# Patient Record
Sex: Male | Born: 1960 | Race: White | Hispanic: No | Marital: Married | Smoking: Never smoker
Health system: Southern US, Community
[De-identification: ages and names within clinical notes are randomized; demographics above are authoritative.]

## PROBLEM LIST (undated history)

## (undated) DIAGNOSIS — E559 Vitamin D deficiency, unspecified: Secondary | ICD-10-CM

## (undated) DIAGNOSIS — I10 Essential (primary) hypertension: Secondary | ICD-10-CM

## (undated) DIAGNOSIS — K5730 Diverticulosis of large intestine without perforation or abscess without bleeding: Secondary | ICD-10-CM

## (undated) DIAGNOSIS — K760 Fatty (change of) liver, not elsewhere classified: Secondary | ICD-10-CM

## (undated) DIAGNOSIS — K5090 Crohn's disease, unspecified, without complications: Secondary | ICD-10-CM

## (undated) DIAGNOSIS — E785 Hyperlipidemia, unspecified: Secondary | ICD-10-CM

## (undated) DIAGNOSIS — N289 Disorder of kidney and ureter, unspecified: Secondary | ICD-10-CM

## (undated) DIAGNOSIS — E538 Deficiency of other specified B group vitamins: Secondary | ICD-10-CM

## (undated) DIAGNOSIS — K7689 Other specified diseases of liver: Secondary | ICD-10-CM

## (undated) DIAGNOSIS — K648 Other hemorrhoids: Secondary | ICD-10-CM

## (undated) DIAGNOSIS — K529 Noninfective gastroenteritis and colitis, unspecified: Secondary | ICD-10-CM

## (undated) HISTORY — DX: Noninfective gastroenteritis and colitis, unspecified: K52.9

## (undated) HISTORY — DX: Deficiency of other specified B group vitamins: E53.8

## (undated) HISTORY — DX: Other specified diseases of liver: K76.89

## (undated) HISTORY — DX: Essential (primary) hypertension: I10

## (undated) HISTORY — DX: Fatty (change of) liver, not elsewhere classified: K76.0

## (undated) HISTORY — DX: Vitamin D deficiency, unspecified: E55.9

## (undated) HISTORY — DX: Crohn's disease, unspecified, without complications: K50.90

## (undated) HISTORY — DX: Other hemorrhoids: K64.8

## (undated) HISTORY — DX: Disorder of kidney and ureter, unspecified: N28.9

## (undated) HISTORY — DX: Hyperlipidemia, unspecified: E78.5

## (undated) HISTORY — DX: Diverticulosis of large intestine without perforation or abscess without bleeding: K57.30

---
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
## 1997-11-09 HISTORY — PX: KNEE SURGERY: SHX244

## 1999-11-10 HISTORY — PX: APPENDECTOMY: SHX54

## 2000-02-22 ENCOUNTER — Encounter: Payer: Self-pay | Admitting: Emergency Medicine

## 2000-02-22 ENCOUNTER — Encounter (INDEPENDENT_AMBULATORY_CARE_PROVIDER_SITE_OTHER): Payer: Self-pay | Admitting: *Deleted

## 2000-02-22 ENCOUNTER — Encounter (INDEPENDENT_AMBULATORY_CARE_PROVIDER_SITE_OTHER): Payer: Self-pay | Admitting: Specialist

## 2000-02-23 ENCOUNTER — Inpatient Hospital Stay (HOSPITAL_COMMUNITY): Admission: EM | Admit: 2000-02-23 | Discharge: 2000-02-24 | Payer: Self-pay | Admitting: Emergency Medicine

## 2000-02-24 ENCOUNTER — Encounter (INDEPENDENT_AMBULATORY_CARE_PROVIDER_SITE_OTHER): Payer: Self-pay | Admitting: *Deleted

## 2005-01-22 ENCOUNTER — Ambulatory Visit: Payer: Self-pay | Admitting: Gastroenterology

## 2006-07-13 ENCOUNTER — Ambulatory Visit: Payer: Self-pay | Admitting: Gastroenterology

## 2006-09-13 ENCOUNTER — Ambulatory Visit: Payer: Self-pay | Admitting: Gastroenterology

## 2006-09-13 ENCOUNTER — Encounter (INDEPENDENT_AMBULATORY_CARE_PROVIDER_SITE_OTHER): Payer: Self-pay | Admitting: *Deleted

## 2006-12-18 ENCOUNTER — Emergency Department (HOSPITAL_COMMUNITY): Admission: EM | Admit: 2006-12-18 | Discharge: 2006-12-18 | Payer: Self-pay | Admitting: Emergency Medicine

## 2006-12-18 ENCOUNTER — Encounter (INDEPENDENT_AMBULATORY_CARE_PROVIDER_SITE_OTHER): Payer: Self-pay | Admitting: *Deleted

## 2007-11-03 IMAGING — CT CT PELVIS W/O CM
2 of 3 series · 16 of 42 positions shown, 19 images · IV contrast (agent unspecified)
Comparison: None available.

CLINICAL DATA: 45 year old with abdominal pain.  Rule out stone.  No hematuria. Prior appendicitis, appendectomy.
 ABDOMEN CT WITHOUT CONTRAST:
TECHNIQUE: Multidetector CT imaging of the abdomen was performed following the standard protocol without IV contrast.
TECHNIQUE: Multidetector CT imaging of the pelvis was performed following the standard protocol without IV contrast.

[Series 2: renal stone · axial · 0.82mm/px · z∈[-479,-79]mm · 13 of 89 slices shown, 16 images]
[im 6/89  soft-tissue]
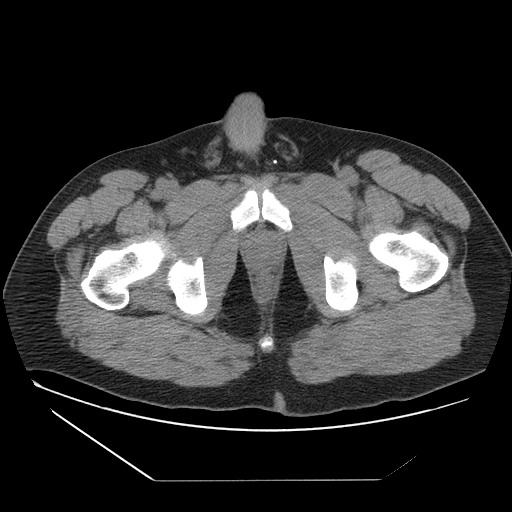
[im 6/89  bone]
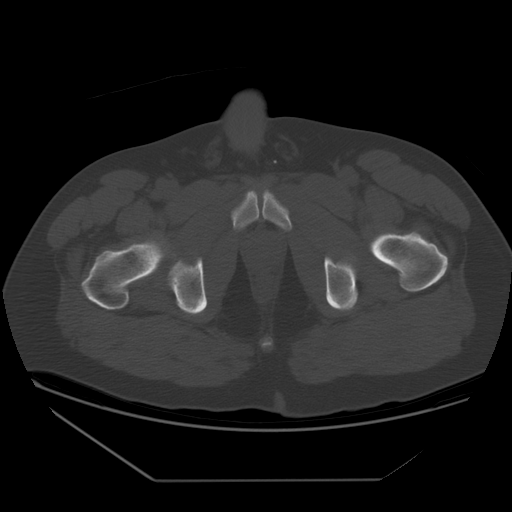
[im 15/89  soft-tissue]
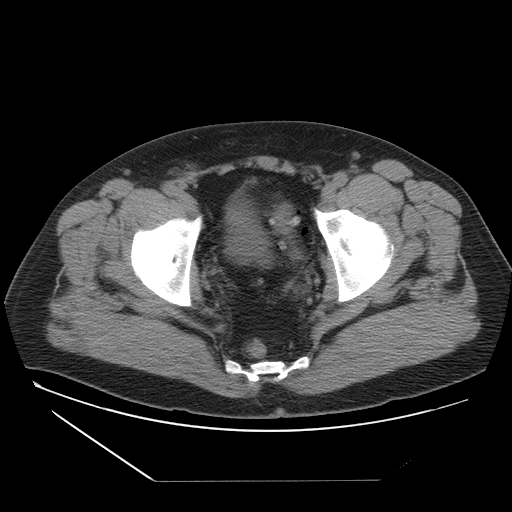
[im 23/89  soft-tissue]
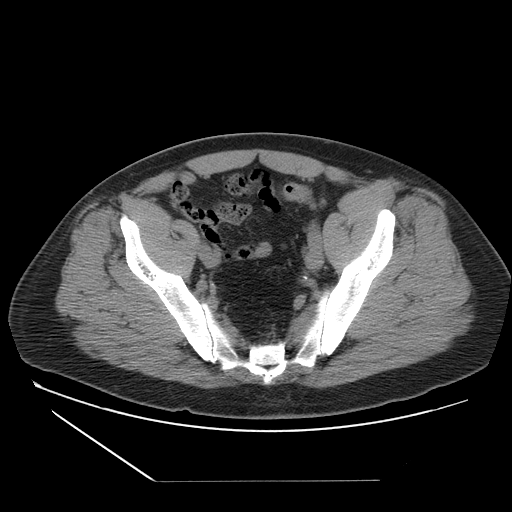
[im 32/89  soft-tissue]
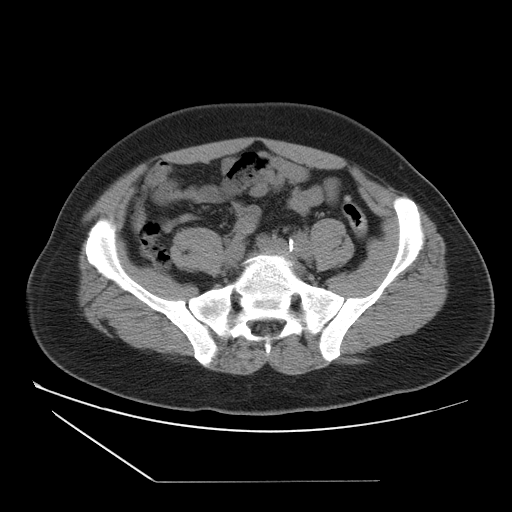
[im 40/89  soft-tissue]
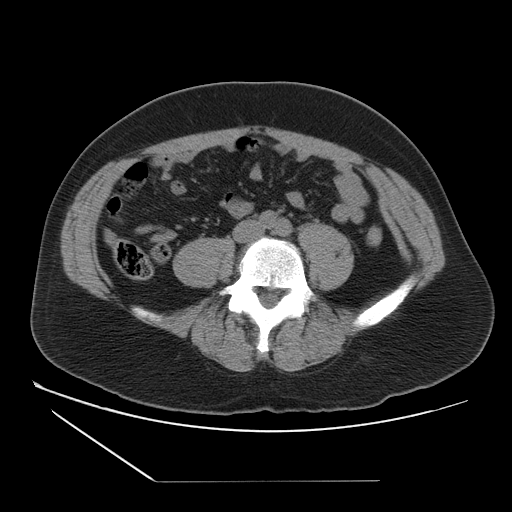
[im 49/89  soft-tissue]
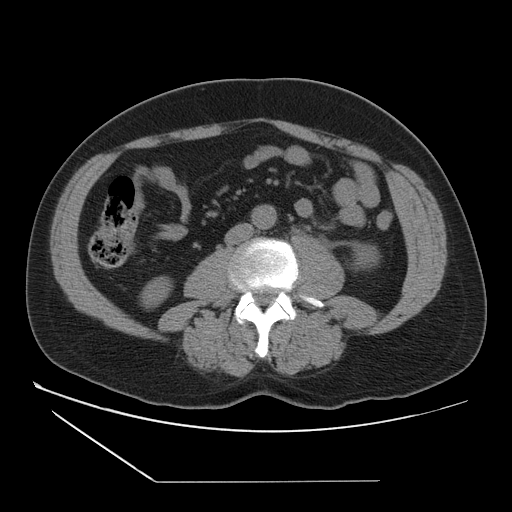
[im 57/89  soft-tissue]
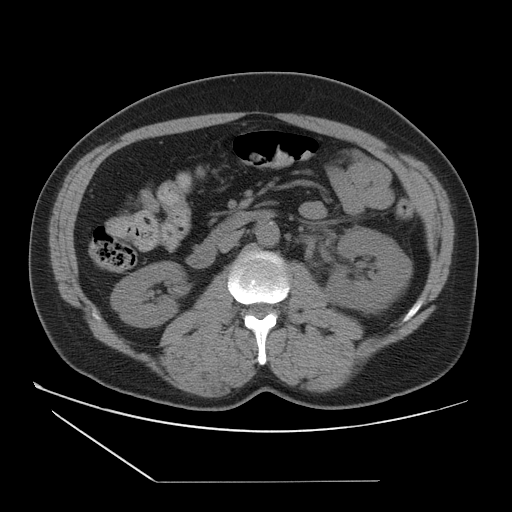
[im 66/89  soft-tissue]
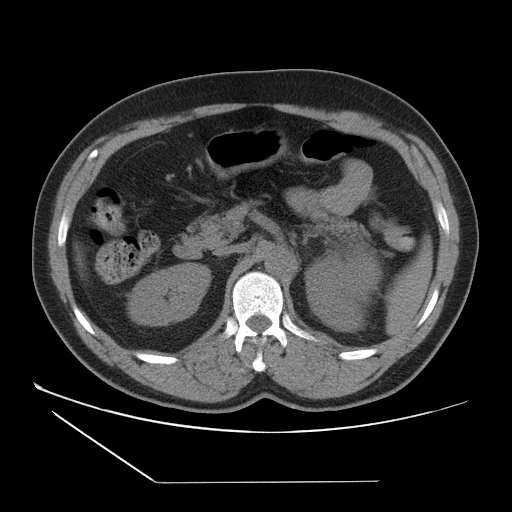
[im 74/89  soft-tissue]
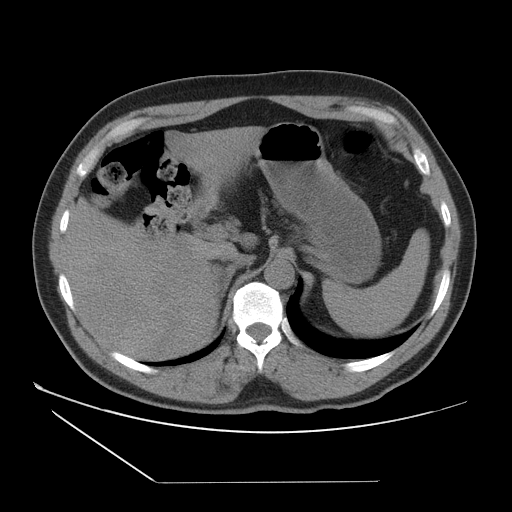
[im 74/89  bone]
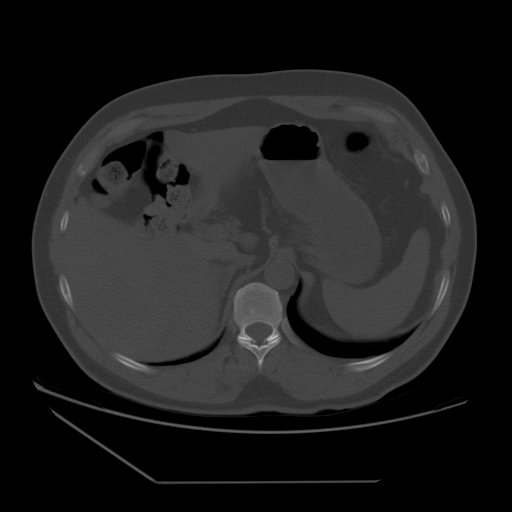
[im 77/89  lung]
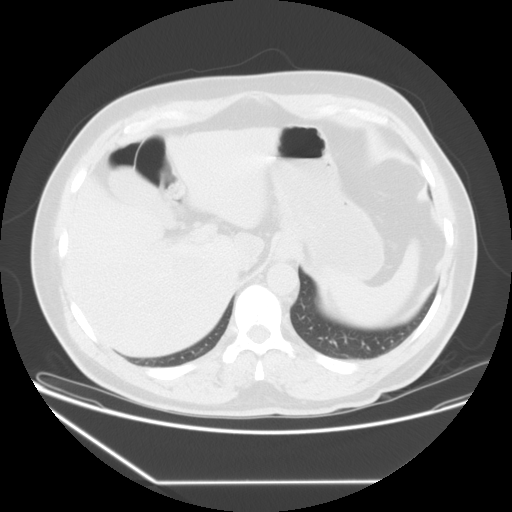
[im 80/89  lung]
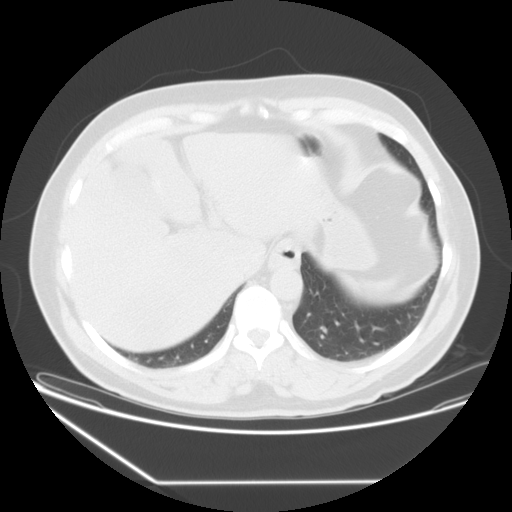
[im 83/89  soft-tissue]
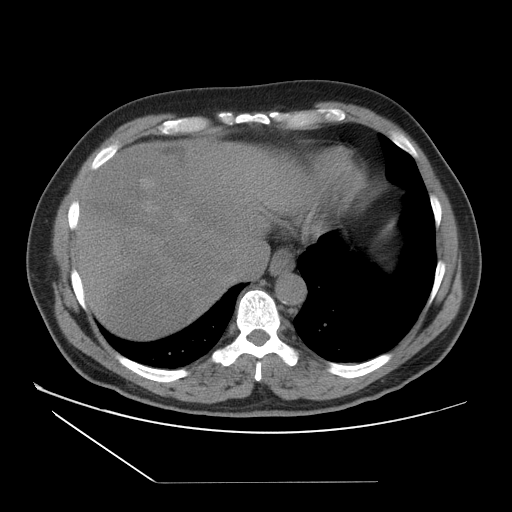
[im 83/89  lung]
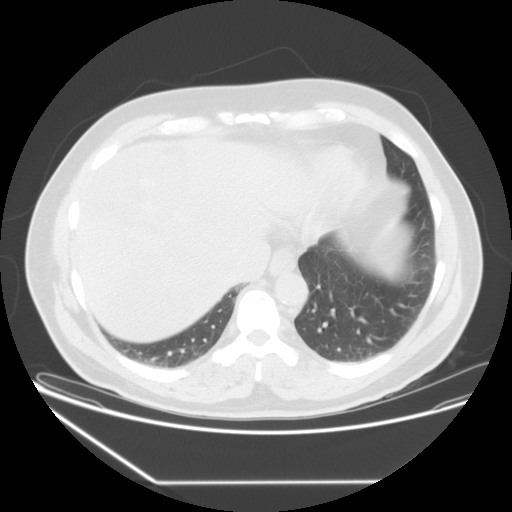
[im 86/89  lung]
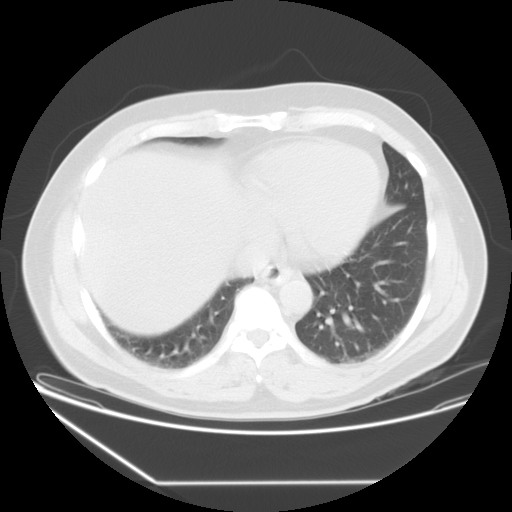

[Series 301: sagittals · sagittal · 0.92mm/px · 3 of 98 slices shown]
[im 20/98  soft-tissue]
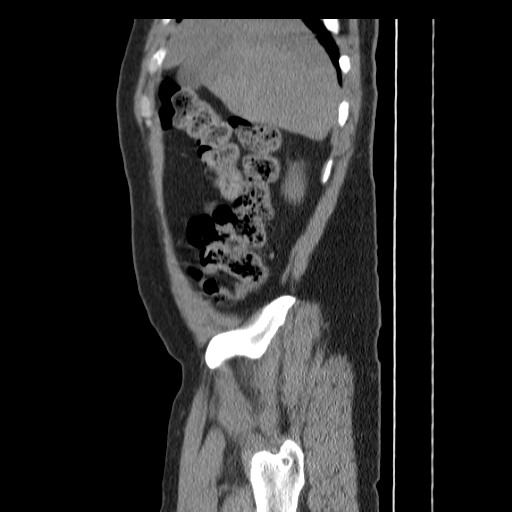
[im 39/98  soft-tissue]
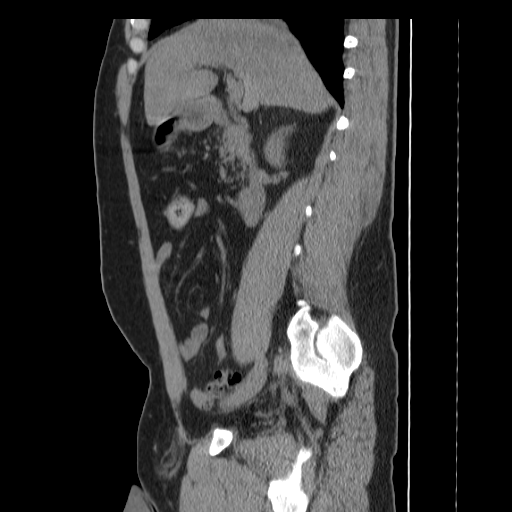
[im 59/98  soft-tissue]
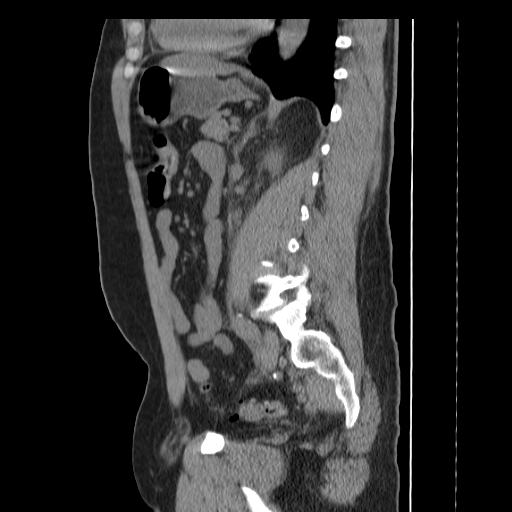

[16 of 42 positions shown; findings below may reference images not displayed]

FINDINGS: Images of the lung bases are unremarkable.  The liver is heterogeneous with both high attenuation and low attenuation regions.  Further evaluation with liver MRI is suggested to exclude hepatic lesions.  
 There is marked left hydronephrosis and perinephric fluid.  The left collecting system is dilated to the level of the mid ureter at L3 where there is a 3 mm calculus causing high-grade obstruction.  
 No focal abnormality is seen within the spleen, pancreas, or right kidney.  The gallbladder is present.  No retroperitoneal adenopathy is identified.
IMPRESSION: 1.  Heterogeneous liver consistent with fatty infiltration but liver lesions are not excluded and further evaluation with MRI is suggested.
 2.  Marked perinephric stranding on the left of the kidney secondary to a 3 mm calculus at the level of L3.  
 PELVIS CT WITHOUT CONTRAST:
FINDINGS: There are numerous colonic diverticula.  No CT evidence for acute diverticulitis however.
IMPRESSION: Diverticulosis.

## 2008-05-16 ENCOUNTER — Telehealth: Payer: Self-pay | Admitting: Gastroenterology

## 2008-05-21 ENCOUNTER — Encounter: Payer: Self-pay | Admitting: Gastroenterology

## 2008-05-21 DIAGNOSIS — K5730 Diverticulosis of large intestine without perforation or abscess without bleeding: Secondary | ICD-10-CM | POA: Insufficient documentation

## 2008-05-21 DIAGNOSIS — E785 Hyperlipidemia, unspecified: Secondary | ICD-10-CM | POA: Insufficient documentation

## 2008-05-21 DIAGNOSIS — K5090 Crohn's disease, unspecified, without complications: Secondary | ICD-10-CM | POA: Insufficient documentation

## 2008-05-21 DIAGNOSIS — K5900 Constipation, unspecified: Secondary | ICD-10-CM | POA: Insufficient documentation

## 2008-05-21 DIAGNOSIS — K7689 Other specified diseases of liver: Secondary | ICD-10-CM

## 2008-05-22 ENCOUNTER — Ambulatory Visit: Payer: Self-pay | Admitting: Gastroenterology

## 2008-05-22 LAB — CONVERTED CEMR LAB: Tissue Transglutaminase Ab, IgA: 0.8 units (ref <7)

## 2009-03-04 ENCOUNTER — Encounter: Payer: Self-pay | Admitting: Gastroenterology

## 2009-05-23 ENCOUNTER — Ambulatory Visit: Payer: Self-pay | Admitting: Gastroenterology

## 2009-05-23 ENCOUNTER — Telehealth: Payer: Self-pay | Admitting: Gastroenterology

## 2009-05-23 LAB — CONVERTED CEMR LAB: Ceruloplasmin: 28 mg/dL (ref 21–63)

## 2009-05-24 DIAGNOSIS — E538 Deficiency of other specified B group vitamins: Secondary | ICD-10-CM | POA: Insufficient documentation

## 2009-05-24 LAB — CONVERTED CEMR LAB
Ferritin: 172.6 ng/mL (ref 22.0–322.0)
Folate: 8.4 ng/mL
Iron: 130 ug/dL (ref 42–165)
Saturation Ratios: 37.9 % (ref 20.0–50.0)
Transferrin: 244.9 mg/dL (ref 212.0–360.0)
Vitamin B-12: 196 pg/mL — ABNORMAL LOW (ref 211–911)

## 2009-05-27 ENCOUNTER — Telehealth: Payer: Self-pay | Admitting: Gastroenterology

## 2009-05-30 ENCOUNTER — Ambulatory Visit: Payer: Self-pay | Admitting: Gastroenterology

## 2009-06-06 ENCOUNTER — Ambulatory Visit: Payer: Self-pay | Admitting: Gastroenterology

## 2009-06-13 ENCOUNTER — Ambulatory Visit: Payer: Self-pay | Admitting: Gastroenterology

## 2009-07-16 ENCOUNTER — Ambulatory Visit: Payer: Self-pay | Admitting: Gastroenterology

## 2009-08-12 ENCOUNTER — Encounter: Payer: Self-pay | Admitting: Gastroenterology

## 2009-08-14 ENCOUNTER — Telehealth (INDEPENDENT_AMBULATORY_CARE_PROVIDER_SITE_OTHER): Payer: Self-pay | Admitting: *Deleted

## 2009-08-14 ENCOUNTER — Ambulatory Visit: Payer: Self-pay | Admitting: Gastroenterology

## 2009-08-21 ENCOUNTER — Telehealth (INDEPENDENT_AMBULATORY_CARE_PROVIDER_SITE_OTHER): Payer: Self-pay | Admitting: *Deleted

## 2010-01-28 ENCOUNTER — Encounter: Payer: Self-pay | Admitting: Gastroenterology

## 2010-05-08 ENCOUNTER — Ambulatory Visit: Payer: Self-pay | Admitting: Gastroenterology

## 2010-05-13 ENCOUNTER — Telehealth: Payer: Self-pay | Admitting: Gastroenterology

## 2010-08-19 ENCOUNTER — Telehealth: Payer: Self-pay | Admitting: Gastroenterology

## 2010-12-11 NOTE — Progress Notes (Signed)
Summary: triage  Medications Added COLAZAL 750 MG CAPS (BALSALAZIDE DISODIUM) 2 tabs two times a day       Phone Note Call from Patient Call back at Home Phone [phone]   Caller: Patient Call For: Sharlett Iles Reason for Call: Talk to Nurse Summary of Call: Patient states that his rx was not sent to his pharmacy, patient there to pick it up. Patient states that he had a flare up in the weekend and is about to go to a trip would like some different meds prescribed. Initial call taken by: Ronalee Red,  May 13, 2010 9:32 AM  Follow-up for Phone Call        Line busy.  Butch Penny Surface RN  May 13, 2010 2:34 PM  Pt has flare up of abd pain this weekend.  Still taking lialda.  To start new med.  See OV note.  Pt is leaving on Sunday to go to [place] and asks if he can have on rx for an antiobiotic to take with him incase he has " a spell" while he is away.  Will start calazal tomorrow.   Follow-up by: Donna Surface RN,  May 13, 2010 4:03 PM  Additional Follow-up for Phone Call Additional follow up Details #1::        no Additional Follow-up by: Jewelz Kobus R Jansen Goodpasture MD FACG,  May 13, 2010 4:15 PM    Additional Follow-up for Phone Call Additional follow up Details #2::    Pt informed.  Instucted pt to get ned medication filled and start on it asap.  Pt instructed to call  if symptoms do not improve. Follow-up by: Donna Surface RN,  May 14, 2010 11:11 AM  New/Updated Medications: COLAZAL 750 MG CAPS (BALSALAZIDE DISODIUM) 2 tabs two times a day Prescriptions: COLAZAL 750 MG CAPS (BALSALAZIDE DISODIUM) 2 tabs two times a day  #120 x 6   Entered by:   Donna Surface RN   Authorized by:   Almeda Ezra R Antonie Borjon MD FACG   Signed by:   Donna Surface RN on 05/13/2010   Method used:   Electronically to        [id number]* (retail)       [address], [place]  [address]       Ph: [phone] or [phone]       Fax: [phone]  RxID:   [id number]

## 2010-12-11 NOTE — Op Note (Signed)
Summary: Appendectomy                    [place]  Patient:    Brian Estes, Brian Estes                          MRN: [id number] Proc. Date: 02/24/00 Adm. Date:  [id number] Disc. Date: [id number] Attending:  Gwenyth Ober                           Operative Report  PREOPERATIVE DIAGNOSIS:  Acute appendicitis.  POSTOPERATIVE DIAGNOSIS:  Acute appendicitis.  PROCEDURE:  Laparoscopic appendectomy.  SURGEON:  Judeth Horn, M.D.  ASSISTANT:  None.  ANESTHESIA:  General endotracheal.  ESTIMATED BLOOD LOSS:  100 cc.  COMPLICATIONS:  Tear of the tip of the distal appendix.  CONDITION:  Stable.  INDICATIONS FOR PROCEDURE:  The patient is a 50 year old gentleman with abdominal pain, severe right lower quadrant, associated with a elevated white count.  he ome in for appendectomy.  FINDINGS:  The patient had an acutely inflamed retrocecal appendix which was removed laparoscopically.  DESCRIPTION OF PROCEDURE:  The patient was taken to the operating room and placed on the table in the supine position.  After an adequate general anesthetic was administered, he was prepped and draped in the usual sterile manner exposing the midline and the right lower quadrant of the abdomen.  Initially, trocars were placed in the supraumbilical, the right upper quadrant a 5 mm) and a suprapubic using 5 and 12 mm respectively.  This was done after insufflating the abdomen with carbon dioxide insufflation up to a maximum intra-abdominal pressure of 50 mmHg using the Veress needle.  Once this was done and the cannulas were in place, the patient was placed in Trendelenburg position. The gallbladder was reflected from the normal.  The patient did have mildly elevated liver function tests.  The patient did have some adhesions of the appendices epiploica of the sigmoid colon to the anterior abdominal wall, which  were taken down using sharp scissor dissection.  There was no bleeding  from this part of the procedure.  The right lower quadrant had a difficultly placed and adhesed appendix which had to be taken off the lateral posterior abdominal wall  using blunt and sharp dissection.  We were able to free it up and then subsequently transect the appendix using an Endo GIA with the blue stapler and then, subsequently, the mesentery was taken between white staplers.  There was some bleeding which had to be Endoclipped individually using ______ .  We irrigated with copious amounts of warm saline and, at the completion, there was no bleeding. We inspected inferiorly into the pelvis.  There was no bleeding.  We subsequently removed all cannulas then closed the suprapubic and the periumbilical fascia using a figure-of-eight stitch of 4-0 Vicryl.  The skin was closed using a running subcuticular stitch of 5-0 Vicryl.  All needle counts, sponge counts and instruments counts were correct.  Each trocar site was closed and injected with  0.25% Marcaine with epinephrine. DD:  02/24/00 TD:  02/24/00 Job: [id number]

## 2010-12-11 NOTE — Progress Notes (Signed)
Summary: Medication   Phone Note Call from Patient Call back at Home Phone [phone]   Caller: Patient Call For: Dr. Sharlett Iles Reason for Call: Talk to Nurse Summary of Call: Wants to discuss his meds. Initial call taken by: Webb Laws,  August 19, 2010 10:20 AM  Follow-up for Phone Call        patient that the colazal constipates him and he wants to change back  to his AZULFIDINE 500 MG TABS which he was on before he started Lialda he changed due to cost to Colazal. What does can he take of AZULFIDINE 500 MG TABS. Follow-up by: Bernita Buffy CMA Deborra Medina),  August 19, 2010 10:31 AM  Additional Follow-up for Phone Call Additional follow up Details #1::        spoke to [facility], [place] and she wants the patient to wait until DRP returns. I have advised the patient that when Dr. Sharlett Iles return I and reviews I will call him back. He now tells me he is not currently on any medication he stopped the Colazal due to the constipation.  Additional Follow-up by: Bernita Buffy CMA Deborra Medina),  August 19, 2010 2:36 PM    Additional Follow-up for Phone Call Additional follow up Details #2::    Azulfidine 500 mg 2 tablets twice a day is fine Follow-up by: Sable Feil MD Lauralee Evener 11:56 AM  Additional Follow-up for Phone Call Additional follow up Details #3:: Details for Additional Follow-up Action Taken: pt aware, rx sent. Additional Follow-up by: Bernita Buffy CMA Deborra Medina),  August 21, 2010 12:32 PM  New/Updated Medications: AZULFIDINE 500 MG TABS (SULFASALAZINE) take two tabs by mouth two times a day Prescriptions: AZULFIDINE 500 MG TABS (SULFASALAZINE) take two tabs by mouth two times a day  #120 x 11   Entered by:   Bernita Buffy CMA ([place])   Authorized by:   Sable Feil MD [facility]   Signed by:   Bernita Buffy CMA ([place]) on 08/21/2010   Method used:   Electronically to        [facility] [phone]* (retail)       [address] [place], [place]  [address]       Ph: [phone] or [phone]       Fax: [phone]   RxID:   [id number]

## 2010-12-11 NOTE — Assessment & Plan Note (Signed)
Summary: YEARLY FOLLOW UP..MED REFILL/SP    History of Present Illness Visit Type: Follow-up Visit Primary GI MD: Verl Blalock MD [place] Primary Provider: Redge Gainer, MD  Requesting Provider: na Chief Complaint: F/u for Crohn's. Patient states that he is doing great and denies any GI complaints. Pt needs Lialda refilled History of Present Illness:   Patient is doing well without GI complaints. He complains that Lialda is too expensive. He is having regular bowel movements without abdominal pain or rectal bleeding. He has some periodically mildly abnormal liver function tests probably related to statin medications. He has had B12 deficiency but is not on regular replacement therapy. He denies any extracolonic manifestations of inflammatory bowel disease. Previous serologic markers suggested Crohn's disease. He is followed regularly by Dr. Redge Gainer and is on Benicar, Crestor, Propecia, and vitamin D. He denies any current neurological difficulties. He is status post appendectomy.   GI Review of Systems      Denies abdominal pain, acid reflux, belching, bloating, chest pain, dysphagia with liquids, dysphagia with solids, heartburn, loss of appetite, nausea, vomiting, vomiting blood, weight loss, and  weight gain.        Denies anal fissure, black tarry stools, change in bowel habit, constipation, diarrhea, diverticulosis, fecal incontinence, heme positive stool, hemorrhoids, irritable bowel syndrome, jaundice, light color stool, liver problems, rectal bleeding, and  rectal pain.    Current Medications (verified): 1)  Lialda 1.2 Gm  Tbec (Mesalamine) .... Take 2 By Mouth Every Morning 2)  Crestor 20 Mg  Tabs (Rosuvastatin Calcium) .Marland Kitchen.. 1 Once Daily 3)  Benicar 20 Mg  Tabs (Olmesartan Medoxomil) .Marland Kitchen.. 1 Once Daily 4)  Propecia 1 Mg  Tabs (Finasteride) .Marland Kitchen.. 1 Once Daily 5)  Vitamin D 1000 Unit  Tabs (Cholecalciferol) .Marland Kitchen.. 1 Once Daily  Allergies (verified): 1)  ! Asa  Past  History:  Past medical, surgical, family and social histories (including risk factors) reviewed for relevance to current acute and chronic problems.  Past Medical History: Reviewed history from 05/23/2009 and no changes required. Crohns Disease Hyperlipidemia Kidney Stones Hypertension  Past Surgical History: Reviewed history from 05/23/2009 and no changes required. right knee surgery 1999 iguinal hernia repair 1976 Appendectomy 2001  Family History: Reviewed history from 05/22/2008 and no changes required. No FH of Colon Cancer: Family History of Heart Disease: mom  Social History: Reviewed history from 05/23/2009 and no changes required. Occupation: Self Employed  Patient has never smoked.  Alcohol Use - no Daily Caffeine Use Illicit Drug Use - no Patient does not get regular exercise.  Married 1 boy 1 girl  Review of Systems  The patient denies allergy/sinus, anemia, anxiety-new, arthritis/joint pain, back pain, blood in urine, breast changes/lumps, change in vision, confusion, cough, coughing up blood, depression-new, fainting, fatigue, fever, headaches-new, hearing problems, heart murmur, heart rhythm changes, itching, muscle pains/cramps, night sweats, nosebleeds, shortness of breath, skin rash, sleeping problems, sore throat, swelling of feet/legs, swollen lymph glands, thirst - excessive, urination - excessive, urination changes/pain, urine leakage, vision changes, voice change, menstrual pain, pregnancy symptoms, thirst - excessive , and urination - excessive .    Vital Signs:  Patient profile:   50 year old male Height:      70 inches Weight:      214 pounds BMI:     30.82 BSA:     2.15 Pulse rate:   76 / minute Pulse rhythm:   regular BP sitting:   132 / 80  (left arm) Cuff size:  regular  Vitals Entered By: Hope Pigeon CMA (May 08, 2010 8:33 AM)  Physical Exam  General:  Well developed, well nourished, no acute distress.healthy appearing.   Head:   Normocephalic and atraumatic. Eyes:  PERRLA, no icterus. Lungs:  Clear throughout to auscultation. Heart:  Regular rate and rhythm; no murmurs, rubs,  or bruits. Extremities:  No clubbing, cyanosis, edema or deformities noted. Neurologic:  Alert and  oriented x4;  grossly normal neurologically. Psych:  Alert and cooperative. Normal mood and affect.   Impression & Recommendations:  Problem # 1:  VITAMIN B12 DEFICIENCY (ICD-266.2) Assessment Improved  B12 thousand micrograms IM today.  Orders: Vit B12 1000 mcg (R6045)  Problem # 2:  FATTY LIVER DISEASE (ICD-571.8) Assessment: Improved Diagnosis is unclear and his abnormal liver function tests may be related to statin medications. Previous hepatic workup otherwise unremarkable. Exam shows no evidence of hepatosplenomegaly or stigmata of chronic liver disease.Currently liver function tests are being tested every 6-12 months.  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 Because of his drug plan we have changed him to Colazaal 2 pills twice a day as tolerated. He is due for colonoscopy followup next year.  Problem # 4:  CONSTIPATION (ICD-564.00) Assessment: Improved continue high-fiber diet as tolerated.  Patient Instructions: 1)  We have sent an electronic prescription for Colazal to your pharmacy for you to pick up. You should take 2 tablets by mouth two times a day. This will be used in place of Lialda. 2)  Please call our office regarding any gastroentestinal concerns. 3)  Copy sent to : Dr Redge Gainer 4)  The medication list was reviewed and reconciled.  All changed / newly prescribed medications were explained.  A complete medication list was provided to the patient / caregiver. 5)  colonoscopy due November 2012.   Medication Administration  Injection # 1:    Medication: Vit B12 1000 mcg    Diagnosis: VITAMIN B12 DEFICIENCY (ICD-266.2)     Route: IM    Site: R deltoid    Exp Date: 2/13    Lot #: [id number]    Mfr: American Regent    Patient tolerated injection without complications    Given by: Madlyn Frankel CMA (AAMA) (May 08, 2010 9:03 AM)  Orders Added: 1)  Vit B12 1000 mcg [W0981]

## 2011-03-27 NOTE — H&P (Signed)
[place]  Patient:    Brian Estes, Brian Estes                          MRN: [id number] Adm. Date:  02/23/00 Attending:  Judeth Horn, M.D.                         History and Physical  CHIEF COMPLAINT:  The patient is a 50 year old gentleman with likely acute appendicitis.  HISTORY OF PRESENT ILLNESS:  Brian Estes started getting ill Easter morning when he was plagued by abdominal pain, mostly in the lower portion of his abdomen.  He ad nausea and subsequently several episodes of vomiting with minimal output because he failed to eat anything after 1 oclock p.m.  He had some chills but no obvious fevers, at least none that were documented at home.  The patient has not had pain of this sort before.  His only past medical history is that of a hernia and some knee surgery.  He has had no obstipation, constipation and no diarrhea.  His pain is now localized to his right lower quadrant and a CT scan demonstrates a distended appendix with periappendiceal inflammatory changes. With these findings, acute appendicitis is suspected.  A surgical consultation as obtained.  PAST MEDICAL HISTORY:  Unremarkable.  He is a nondrinker, nonsmoker, has minimal history.  No hypertension.  No pulmonary, renal or liver disease.  MEDICATIONS:  He takes no prescribed medication.  ALLERGIES:  He has no known drug allergies.  PAST SURGICAL HISTORY:  That of inguinal hernia and knee surgery.  PHYSICAL EXAMINATION:  GENERAL:  On physical examination he is well-nourished, well-developed in mild o moderate acute distress.  He has had pain medication, however, it is wearing off. He has no fevers or chills.  VITAL SIGNS:  His vital signs are stable.  HEENT:  He is normocephalic, atraumatic.  He is anicteric.  NECK:  Supple.  CHEST:  Clear.  CARDIAC:  He has a regular rhythm and rate with no murmurs, gallops, lifts, or heaves.  ABDOMEN:  Soft but tender mostly in  the right lower quadrant.  He has no Rovsing sign, but he does have tap shake and cough tenderness in the right lower quadrant.  RECTAL:  Examination demonstrates no palpable masses and he did not increase his tenderness.  NEUROLOGICAL:  Cranial nerves 2-12 are grossly intact.  LABORATORY STUDIES:  He has a white count of 19,000 with a left shift and normal hematocrit.  IMPRESSION:  Based on the computed tomographic scan findings, physical examination, his history, and his localized tenderness, the patient has acute appendicitis. It appears to be in good position for a laparoscopic procedure, and it does not appear to have perforated.  He will receive preoperative antibiotics prior to going for laparoscopic appendectomy with the possibility that we may need to open.  The patient understands the risks and benefits of the procedure and he wishes to proceed. DD:  02/23/00 TD:  02/23/00 Job: [id number]

## 2011-03-27 NOTE — Op Note (Signed)
[place]  Patient:    Brian Estes, Brian Estes                          MRN: [id number] Proc. Date: 02/24/00 Adm. Date:  [id number] Disc. Date: [id number] Attending:  Gwenyth Ober                           Operative Report  PREOPERATIVE DIAGNOSIS:  Acute appendicitis.  POSTOPERATIVE DIAGNOSIS:  Acute appendicitis.  PROCEDURE:  Laparoscopic appendectomy.  SURGEON:  Judeth Horn, M.D.  ASSISTANT:  None.  ANESTHESIA:  General endotracheal.  ESTIMATED BLOOD LOSS:  100 cc.  COMPLICATIONS:  Tear of the tip of the distal appendix.  CONDITION:  Stable.  INDICATIONS FOR PROCEDURE:  The patient is a 50 year old gentleman with abdominal pain, severe right lower quadrant, associated with a elevated white count.  he ome in for appendectomy.  FINDINGS:  The patient had an acutely inflamed retrocecal appendix which was removed laparoscopically.  DESCRIPTION OF PROCEDURE:  The patient was taken to the operating room and placed on the table in the supine position.  After an adequate general anesthetic was administered, he was prepped and draped in the usual sterile manner exposing the midline and the right lower quadrant of the abdomen.  Initially, trocars were placed in the supraumbilical, the right upper quadrant a 5 mm) and a suprapubic using 5 and 12 mm respectively.  This was done after insufflating the abdomen with carbon dioxide insufflation up to a maximum intra-abdominal pressure of 50 mmHg using the Veress needle.  Once this was done and the cannulas were in place, the patient was placed in Trendelenburg position. The gallbladder was reflected from the normal.  The patient did have mildly elevated liver function tests.  The patient did have some adhesions of the appendices epiploica of the sigmoid colon to the anterior abdominal wall, which  were taken down using sharp scissor dissection.  There was no bleeding from this part of the  procedure.  The right lower quadrant had a difficultly placed and adhesed appendix which had to be taken off the lateral posterior abdominal wall  using blunt and sharp dissection.  We were able to free it up and then subsequently transect the appendix using an Endo GIA with the blue stapler and then, subsequently, the mesentery was taken between white staplers.  There was some bleeding which had to be Endoclipped individually using ______ .  We irrigated with copious amounts of warm saline and, at the completion, there was no bleeding. We inspected inferiorly into the pelvis.  There was no bleeding.  We subsequently removed all cannulas then closed the suprapubic and the periumbilical fascia using a figure-of-eight stitch of 4-0 Vicryl.  The skin was closed using a running subcuticular stitch of 5-0 Vicryl.  All needle counts, sponge counts and instruments counts were correct.  Each trocar site was closed and injected with  0.25% Marcaine with epinephrine. DD:  02/24/00 TD:  02/24/00 Job: [id number]

## 2011-04-27 ENCOUNTER — Telehealth: Payer: Self-pay | Admitting: Gastroenterology

## 2011-04-27 NOTE — Telephone Encounter (Signed)
Pt reports insomnia when he takes the generic Azulifidine- he knows this because he can stop it and he sleeps fine. Pt has an appt on 06/02/11 and he will discuss then. I offered pt an earlier appt and he stated he has a trip coming up and he doesn't want to get in the middle of a work up and " mess his trip up". Pt will call if he wants to be seen earlier.

## 2011-05-08 ENCOUNTER — Telehealth: Payer: Self-pay | Admitting: Gastroenterology

## 2011-05-08 MED ORDER — MESALAMINE ER 250 MG PO CPCR: 1000.0000 mg | ORAL_CAPSULE | Freq: Four times a day (QID) | ORAL | Status: DC

## 2011-05-08 NOTE — Telephone Encounter (Signed)
Pt called on 04/27/11 to report:: Shella Maxim, RN 04/27/2011 2:09 PM Signed  Pt reports insomnia when he takes the generic Azulifidine- he knows this because he can stop it and he sleeps fine. Pt has an appt on 06/02/11 and he will discuss then. I offered pt an earlier appt and he stated he has a trip coming up and he doesn't want to get in the middle of a work up and " mess his trip up". Pt will call if he wants to be seen earlier.  Message must have gotten lost in routing to Dr Sharlett Iles. Dr Sharlett Iles, pt wants to know if he can switch to Pentasa until he sees you on 06/02/11- he has been on Pentasa before? Thanks.

## 2011-05-08 NOTE — Telephone Encounter (Signed)
lmom to notify  pt I ordered the Pentasa per Dr Sharlett Iles.

## 2011-06-02 ENCOUNTER — Encounter: Payer: Self-pay | Admitting: Gastroenterology

## 2011-06-02 ENCOUNTER — Ambulatory Visit (INDEPENDENT_AMBULATORY_CARE_PROVIDER_SITE_OTHER): Payer: BC Managed Care – PPO | Admitting: Gastroenterology

## 2011-06-02 VITALS — BP 132/74 | HR 62 | Ht 69.0 in | Wt 206.0 lb

## 2011-06-02 DIAGNOSIS — K529 Noninfective gastroenteritis and colitis, unspecified: Secondary | ICD-10-CM

## 2011-06-02 DIAGNOSIS — K5289 Other specified noninfective gastroenteritis and colitis: Secondary | ICD-10-CM

## 2011-06-02 MED ORDER — MESALAMINE ER 500 MG PO CPCR: 1000.0000 mg | ORAL_CAPSULE | Freq: Two times a day (BID) | ORAL | Status: DC

## 2011-06-02 MED ORDER — PEG-KCL-NACL-NASULF-NA ASC-C 100 G PO SOLR: 1.0000 | Freq: Once | ORAL | Status: DC

## 2011-06-02 NOTE — Progress Notes (Signed)
This is a 50 year old Caucasian male with inflammatory bowel disease responsive to by mouth aminosalicylate therapy. He could not tolerate Azulfidine, and is currently on Pentasa 1 g twice a day. Currently he denies diarrhea, rectal bleeding, or abdominal pain. In fact, he has chronic constipation, but does not require laxative use. Otherwise patient is asymptomatic sent for mild hyperlipidemia. Recent labs by Dr. Redge Gainer are reviewed and were unremarkable. Patient's last colonoscopy was 5 years ago. Family history is noncontributory.  Current Medications, Allergies, Past Medical History, Past Surgical History, Family History and Social History were reviewed in [employer] record.  Pertinent Review of Systems Negative   Physical Exam: Awake and alert no acute distress appearing his stated age. I cannot appreciate stigmata of chronic liver disease. Abdominal exam shows no organomegaly, masses or tenderness. Chest is clear and there are no murmurs gallops or rubs, and he appears to be in a regular rhythm. Peripheral extremities are unremarkable mental status is clear.    Assessment and Plan: Inflammatory bowel disease, suspected Crohn's colitis. I have renewed his aminosalicylate therapy and schedule followup colonoscopy at his convenience. Encounter Diagnosis  Name Primary?  . Colitis Yes

## 2011-06-02 NOTE — Patient Instructions (Addendum)
We have completed the [facility] form and returned it to you. And we have printed you a Rx for Pentasa to use depending on your patient assistance status. You have been given samples of Pentasa today, if you need more please call back.  We have given you a sheet on [place]. Your procedure has been scheduled for 06/24/2011, please follow the seperate instructions.  Please go to the basement today for your labs.

## 2011-06-24 ENCOUNTER — Ambulatory Visit (AMBULATORY_SURGERY_CENTER): Payer: BC Managed Care – PPO | Admitting: Gastroenterology

## 2011-06-24 ENCOUNTER — Encounter: Payer: Self-pay | Admitting: Gastroenterology

## 2011-06-24 VITALS — BP 123/79 | HR 68 | Temp 97.6°F | Resp 18 | Ht 70.0 in | Wt 206.0 lb

## 2011-06-24 DIAGNOSIS — K529 Noninfective gastroenteritis and colitis, unspecified: Secondary | ICD-10-CM

## 2011-06-24 DIAGNOSIS — K5289 Other specified noninfective gastroenteritis and colitis: Secondary | ICD-10-CM

## 2011-06-24 DIAGNOSIS — K5730 Diverticulosis of large intestine without perforation or abscess without bleeding: Secondary | ICD-10-CM

## 2011-06-24 MED ORDER — SODIUM CHLORIDE 0.9 % IV SOLN: 500.0000 mL | INTRAVENOUS | Status: DC

## 2011-06-24 NOTE — Progress Notes (Signed)
Pt states in RR that "whenever I have a colonoscopy, I always have pain when I get home.  I am on the floor in pain."  Pt on left side, HOB down, warm fluids and Levsin given.  Pt able to pass large amt of air.  Pt to BR before discharge and able to pass a lot of air.  Pt states, " I feel pretty good and am ready to go home."

## 2011-06-24 NOTE — Patient Instructions (Signed)
Please review discharge instructions (blue and green sheets)  Call Dr. Buel Ream office in the next few days to set up follow up appointment for 3 weeks

## 2011-06-25 ENCOUNTER — Telehealth: Payer: Self-pay | Admitting: *Deleted

## 2011-06-25 NOTE — Telephone Encounter (Signed)

## 2011-07-01 ENCOUNTER — Encounter: Payer: Self-pay | Admitting: Gastroenterology

## 2011-07-14 ENCOUNTER — Ambulatory Visit (INDEPENDENT_AMBULATORY_CARE_PROVIDER_SITE_OTHER): Payer: BC Managed Care – PPO | Admitting: Gastroenterology

## 2011-07-14 ENCOUNTER — Encounter: Payer: Self-pay | Admitting: Gastroenterology

## 2011-07-14 VITALS — BP 112/80 | HR 72 | Ht 70.0 in | Wt 206.6 lb

## 2011-07-14 DIAGNOSIS — K5730 Diverticulosis of large intestine without perforation or abscess without bleeding: Secondary | ICD-10-CM

## 2011-07-14 DIAGNOSIS — K5010 Crohn's disease of large intestine without complications: Secondary | ICD-10-CM | POA: Insufficient documentation

## 2011-07-14 NOTE — Patient Instructions (Addendum)
Today we gave you Pentasa samples and once they are gone please call back for more we will continue to give samples as long as we have them.

## 2011-07-14 NOTE — Progress Notes (Signed)
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 He denies any gastrointestinal complaints today such as crampy abdominal pain, diarrhea, rectal bleeding. His appetite is good his weight is stable. Otherwise the patient is in excellent health.   Current Medications, Allergies, Past Medical History, Past Surgical History, Family History and Social History were reviewed in [employer] record.   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 The patient will see me for colonoscopy every 5 years or when necessary as needed. Encounter Diagnoses  Name Primary?  . Segmental colitis Yes  . Diverticulosis of colon (without mention of hemorrhage)

## 2011-07-15 ENCOUNTER — Telehealth: Payer: Self-pay | Admitting: *Deleted

## 2011-07-15 NOTE — Telephone Encounter (Signed)
Advised pt that his pt assistance drug came to our office and i will put it out front to pick up today.

## 2011-07-16 ENCOUNTER — Ambulatory Visit: Payer: BC Managed Care – PPO | Admitting: Gastroenterology

## 2011-10-08 ENCOUNTER — Telehealth: Payer: Self-pay | Admitting: *Deleted

## 2011-10-08 NOTE — Telephone Encounter (Signed)
Patients Pentasa drug assistance arrived he will come by and pick up one day next week

## 2012-01-01 ENCOUNTER — Telehealth: Payer: Self-pay | Admitting: *Deleted

## 2012-01-01 NOTE — Telephone Encounter (Signed)
Patient assistance Pentasa drugs came. Pt will pick up next week.

## 2012-02-03 ENCOUNTER — Telehealth: Payer: Self-pay | Admitting: Gastroenterology

## 2012-02-03 NOTE — Telephone Encounter (Signed)
Pt needed to know what his medical dx is for new insurance. Gave him dx of crohns per his problem list

## 2012-04-05 ENCOUNTER — Other Ambulatory Visit: Payer: Self-pay | Admitting: Gastroenterology

## 2012-04-05 DIAGNOSIS — K529 Noninfective gastroenteritis and colitis, unspecified: Secondary | ICD-10-CM

## 2012-04-05 MED ORDER — MESALAMINE ER 500 MG PO CPCR: 1000.0000 mg | ORAL_CAPSULE | Freq: Two times a day (BID) | ORAL | Status: DC

## 2012-04-05 NOTE — Telephone Encounter (Signed)
rx faxed to [facility], number is not working.

## 2012-04-20 ENCOUNTER — Telehealth: Payer: Self-pay | Admitting: Gastroenterology

## 2012-04-20 NOTE — Telephone Encounter (Signed)
Advised ok to ship

## 2012-11-30 ENCOUNTER — Telehealth: Payer: Self-pay | Admitting: Gastroenterology

## 2012-11-30 NOTE — Telephone Encounter (Signed)
Patient has not been here over a year and he is not sure if he needs an office visit again.  Patient wants me to ask Dr. Sharlett Iles if he needs office visit and call him tomorrow at [phone].  I also advised patient that we do not have samples of Pentasa and patient said that he cannot afford the medication.  I advised patient I will ask Dr. Sharlett Iles is there another medication we can send that can replace Pentasa or if we can do patient assistant form for it.  Patient verbalized understanding.  Will call patient back in the morning

## 2012-12-01 ENCOUNTER — Telehealth: Payer: Self-pay | Admitting: *Deleted

## 2012-12-01 NOTE — Telephone Encounter (Signed)
No.. he could not tolerate Azulfidine.  He  Will have to pay for his medications as do most patient or  Apply  for Medicaid

## 2012-12-01 NOTE — Telephone Encounter (Signed)
Patient stated that if he has to pay for medication he will and he has a few samples left at home.  Patient said that he will call me back when he is ready for me to send a prescription in

## 2012-12-01 NOTE — Telephone Encounter (Signed)
We do not have any Pentasa samples and patient cannot afford prescription.  Is there something else we can give him?

## 2013-05-16 ENCOUNTER — Other Ambulatory Visit (INDEPENDENT_AMBULATORY_CARE_PROVIDER_SITE_OTHER): Payer: BC Managed Care – PPO

## 2013-05-16 DIAGNOSIS — Z79899 Other long term (current) drug therapy: Secondary | ICD-10-CM

## 2013-05-16 DIAGNOSIS — E559 Vitamin D deficiency, unspecified: Secondary | ICD-10-CM

## 2013-05-16 DIAGNOSIS — I10 Essential (primary) hypertension: Secondary | ICD-10-CM

## 2013-05-16 DIAGNOSIS — E785 Hyperlipidemia, unspecified: Secondary | ICD-10-CM

## 2013-05-16 DIAGNOSIS — R5383 Other fatigue: Secondary | ICD-10-CM

## 2013-05-16 LAB — BASIC METABOLIC PANEL WITH GFR
CO2: 25 mEq/L (ref 19–32)
Calcium: 9.8 mg/dL (ref 8.4–10.5)
Creat: 0.87 mg/dL (ref 0.50–1.35)
Glucose, Bld: 88 mg/dL (ref 70–99)

## 2013-05-16 LAB — POCT CBC
Hemoglobin: 15.2 g/dL (ref 14.1–18.1)
Lymph, poc: 2.4 (ref 0.6–3.4)
MCHC: 36.8 g/dL — AB (ref 31.8–35.4)
MPV: 7.2 fL (ref 0–99.8)
POC Granulocyte: 4 (ref 2–6.9)
Platelet Count, POC: 293 10*3/uL (ref 142–424)
RDW, POC: 12.6 %

## 2013-05-16 LAB — HEPATIC FUNCTION PANEL
ALT: 45 U/L (ref 0–53)
Bilirubin, Direct: 0.2 mg/dL (ref 0.0–0.3)
Indirect Bilirubin: 0.9 mg/dL (ref 0.0–0.9)
Total Bilirubin: 1.1 mg/dL (ref 0.3–1.2)

## 2013-05-16 NOTE — Progress Notes (Signed)
Pt came for labs only

## 2013-05-17 LAB — NMR LIPOPROFILE WITH LIPIDS
Cholesterol, Total: 205 mg/dL — ABNORMAL HIGH (ref <200)
HDL Particle Number: 23.4 umol/L — ABNORMAL LOW (ref >=30.5)
LDL (calc): 153 mg/dL — ABNORMAL HIGH (ref <100)
LP-IR Score: 62 — ABNORMAL HIGH (ref <=45)
Large VLDL-P: 2.3 nmol/L (ref <=2.7)
Small LDL Particle Number: 892 nmol/L — ABNORMAL HIGH (ref <=527)

## 2013-05-24 ENCOUNTER — Ambulatory Visit (INDEPENDENT_AMBULATORY_CARE_PROVIDER_SITE_OTHER): Payer: BC Managed Care – PPO | Admitting: Family Medicine

## 2013-05-24 ENCOUNTER — Encounter: Payer: Self-pay | Admitting: Family Medicine

## 2013-05-24 VITALS — BP 106/70 | HR 62 | Temp 98.4°F | Ht 69.0 in | Wt 204.6 lb

## 2013-05-24 DIAGNOSIS — Z789 Other specified health status: Secondary | ICD-10-CM

## 2013-05-24 DIAGNOSIS — K5010 Crohn's disease of large intestine without complications: Secondary | ICD-10-CM

## 2013-05-24 DIAGNOSIS — I10 Essential (primary) hypertension: Secondary | ICD-10-CM

## 2013-05-24 DIAGNOSIS — M791 Myalgia, unspecified site: Secondary | ICD-10-CM

## 2013-05-24 DIAGNOSIS — IMO0001 Reserved for inherently not codable concepts without codable children: Secondary | ICD-10-CM

## 2013-05-24 DIAGNOSIS — Z888 Allergy status to other drugs, medicaments and biological substances status: Secondary | ICD-10-CM

## 2013-05-24 DIAGNOSIS — E785 Hyperlipidemia, unspecified: Secondary | ICD-10-CM

## 2013-05-24 NOTE — Patient Instructions (Addendum)
Continue to take current medications.  Continue aggressive therapeutic lifestyle changes which include diet and exercise Always observe fall precautions For dizziness episodes during and after flying, try meclizine 12.5 mg 3-4 times daily or a nondrowsy antihistamine over-the-counter like Allegra or Claritin

## 2013-05-24 NOTE — Progress Notes (Signed)
  Subjective:    Patient ID: Brian Estes, male    DOB: 1961-02-29, 52 y.o.   MRN: [id number]  HPI Patient returns today for followup of chronic medical problems. These include hyperlipidemia, hypertension, and Crohn's disease. He is statin intolerant. He has a history of B12 deficiency diverticulosis and fatty liver disease. Patient has been off of pravastatin for 2 months.   Review of Systems  Constitutional: Negative.   HENT: Negative.   Eyes: Negative.   Respiratory: Negative.   Cardiovascular: Negative.   Gastrointestinal: Positive for abdominal pain (intermitent due to Chrohn's).  Endocrine: Negative.   Genitourinary: Negative.   Musculoskeletal: Negative.   Skin: Negative.   Allergic/Immunologic: Negative.   Neurological: Negative.   Hematological: Negative.   Psychiatric/Behavioral: The patient is nervous/anxious (occasional).        Objective:   Physical Exam BP 106/70  Pulse 62  Temp(Src) 98.4 F (36.9 C) (Oral)  Ht 5' 9"  (1.753 m)  Wt 204 lb 9.6 oz (92.806 kg)  BMI 30.2 kg/m2  The patient appeared well nourished and normally developed, alert and oriented to time and place. Speech, behavior and judgement appear normal. Vital signs as documented.  Head exam is unremarkable. No scleral icterus or pallor noted.  Neck is without jugular venous distension, thyromegally, or carotid bruits. Carotid upstrokes are brisk bilaterally. No cervical adenopathy. Lungs are clear anteriorly and posteriorly to auscultation. Normal respiratory effort. Cardiac exam reveals regular rate and rhythm at 60 per minute. First and second heart sounds normal.  No murmurs, rubs or gallops.  Abdominal exam reveals normal bowl sounds, no masses, no organomegaly and no aortic enlargement. No inguinal adenopathy. Extremities are nonedematous and both femoral  pulses are normal. Skin without pallor or jaundice.  Warm and dry, without rash. Neurologic exam reveals normal deep tendon reflexes and  normal sensation.          Assessment & Plan:  Hyperlipidemia  Statin intolerance  Crohn's colitis, without complications  Hypertension  Patient Instructions  Continue to take current medications.  Continue aggressive therapeutic lifestyle changes which include diet and exercise Always observe fall precautions For dizziness episodes during and after flying, try meclizine 12.5 mg 3-4 times daily or a nondrowsy antihistamine over-the-counter like Allegra or Claritin   We will draw a CK today and repeat this in 6-7 weeks Do not start Crestor 5 mg until the CK from today is back  Arrie Senate MD

## 2013-05-25 NOTE — Addendum Note (Signed)
Addended by: Earlene Plater on: 05/25/2013 08:42 AM   Modules accepted: Orders

## 2013-06-16 ENCOUNTER — Other Ambulatory Visit: Payer: Self-pay | Admitting: Family Medicine

## 2013-06-27 ENCOUNTER — Other Ambulatory Visit: Payer: BC Managed Care – PPO

## 2013-07-09 ENCOUNTER — Other Ambulatory Visit: Payer: Self-pay | Admitting: Family Medicine

## 2013-07-13 NOTE — Telephone Encounter (Signed)
Last seen 11/14/12  DWM

## 2013-07-14 ENCOUNTER — Other Ambulatory Visit: Payer: Self-pay | Admitting: Family Medicine

## 2013-07-14 NOTE — Telephone Encounter (Signed)
cALLED IN

## 2013-07-14 NOTE — Telephone Encounter (Signed)
Patient needs to be seen. Has exceeded time since last visit. Needs to bring all medications to next appointment. last refill in Epic

## 2013-07-18 ENCOUNTER — Other Ambulatory Visit: Payer: Self-pay

## 2013-07-18 MED ORDER — LOSARTAN POTASSIUM 100 MG PO TABS: 100.0000 mg | ORAL_TABLET | Freq: Every day | ORAL | Status: DC

## 2013-07-28 ENCOUNTER — Telehealth: Payer: Self-pay | Admitting: Family Medicine

## 2013-07-28 MED ORDER — LOSARTAN POTASSIUM 25 MG PO TABS: 25.0000 mg | ORAL_TABLET | Freq: Every day | ORAL | Status: DC

## 2013-07-28 NOTE — Telephone Encounter (Signed)
Brian Estes sent in new script.  Pt aware.

## 2013-12-01 ENCOUNTER — Other Ambulatory Visit (INDEPENDENT_AMBULATORY_CARE_PROVIDER_SITE_OTHER): Payer: BC Managed Care – PPO

## 2013-12-01 DIAGNOSIS — I10 Essential (primary) hypertension: Secondary | ICD-10-CM

## 2013-12-01 DIAGNOSIS — E559 Vitamin D deficiency, unspecified: Secondary | ICD-10-CM

## 2013-12-01 DIAGNOSIS — R5383 Other fatigue: Principal | ICD-10-CM

## 2013-12-01 DIAGNOSIS — E785 Hyperlipidemia, unspecified: Secondary | ICD-10-CM

## 2013-12-01 DIAGNOSIS — R5381 Other malaise: Secondary | ICD-10-CM

## 2013-12-01 LAB — POCT CBC
Granulocyte percent: 56.3 %G (ref 37–80)
HEMATOCRIT: 44 % (ref 43.5–53.7)
Hemoglobin: 15.2 g/dL (ref 14.1–18.1)
LYMPH, POC: 2.4 (ref 0.6–3.4)
MCH: 31 pg (ref 27–31.2)
MCHC: 34.6 g/dL (ref 31.8–35.4)
MCV: 89.7 fL (ref 80–97)
MPV: 8 fL (ref 0–99.8)
PLATELET COUNT, POC: 301 10*3/uL (ref 142–424)
POC Granulocyte: 3.7 (ref 2–6.9)
POC LYMPH PERCENT: 36.3 %L (ref 10–50)
RBC: 4.9 M/uL (ref 4.69–6.13)
RDW, POC: 12.7 %
WBC: 6.5 10*3/uL (ref 4.6–10.2)

## 2013-12-01 NOTE — Progress Notes (Signed)
Patient came in for labs only.

## 2013-12-03 LAB — VITAMIN D 25 HYDROXY (VIT D DEFICIENCY, FRACTURES): Vit D, 25-Hydroxy: 15.9 ng/mL — ABNORMAL LOW (ref 30.0–100.0)

## 2013-12-03 LAB — NMR, LIPOPROFILE
Cholesterol: 221 mg/dL — ABNORMAL HIGH (ref <200)
HDL Cholesterol by NMR: 37 mg/dL — ABNORMAL LOW (ref >=40)
HDL PARTICLE NUMBER: 29.1 umol/L — AB (ref >=30.5)
LDL Particle Number: 2394 nmol/L — ABNORMAL HIGH (ref <1000)
LDL Size: 20.1 nm — ABNORMAL LOW (ref >20.5)
LDLC SERPL CALC-MCNC: 159 mg/dL — ABNORMAL HIGH (ref <100)
LP-IR SCORE: 57 — AB (ref <=45)
SMALL LDL PARTICLE NUMBER: 1521 nmol/L — AB (ref <=527)
Triglycerides by NMR: 127 mg/dL (ref <150)

## 2013-12-03 LAB — BMP8+EGFR
BUN/Creatinine Ratio: 16 (ref 9–20)
BUN: 16 mg/dL (ref 6–24)
CO2: 24 mmol/L (ref 18–29)
Calcium: 9.4 mg/dL (ref 8.7–10.2)
Chloride: 100 mmol/L (ref 97–108)
Creatinine, Ser: 0.97 mg/dL (ref 0.76–1.27)
GFR calc Af Amer: 103 mL/min/{1.73_m2} (ref >59)
GFR calc non Af Amer: 89 mL/min/{1.73_m2} (ref >59)
Glucose: 88 mg/dL (ref 65–99)
POTASSIUM: 4.2 mmol/L (ref 3.5–5.2)
SODIUM: 140 mmol/L (ref 134–144)

## 2013-12-03 LAB — HEPATIC FUNCTION PANEL
ALBUMIN: 4.3 g/dL (ref 3.5–5.5)
ALK PHOS: 84 IU/L (ref 39–117)
ALT: 43 IU/L (ref 0–44)
AST: 38 IU/L (ref 0–40)
BILIRUBIN TOTAL: 0.7 mg/dL (ref 0.0–1.2)
Bilirubin, Direct: 0.17 mg/dL (ref 0.00–0.40)
TOTAL PROTEIN: 7.4 g/dL (ref 6.0–8.5)

## 2013-12-11 ENCOUNTER — Encounter: Payer: Self-pay | Admitting: Family Medicine

## 2013-12-11 ENCOUNTER — Ambulatory Visit (INDEPENDENT_AMBULATORY_CARE_PROVIDER_SITE_OTHER): Payer: BC Managed Care – PPO

## 2013-12-11 ENCOUNTER — Ambulatory Visit (INDEPENDENT_AMBULATORY_CARE_PROVIDER_SITE_OTHER): Payer: BC Managed Care – PPO | Admitting: Family Medicine

## 2013-12-11 ENCOUNTER — Encounter (INDEPENDENT_AMBULATORY_CARE_PROVIDER_SITE_OTHER): Payer: Self-pay

## 2013-12-11 VITALS — BP 127/84 | HR 62 | Temp 98.5°F | Ht 69.0 in | Wt 206.0 lb

## 2013-12-11 DIAGNOSIS — Z0000 Encounter for general adult medical examination without abnormal findings: Secondary | ICD-10-CM

## 2013-12-11 DIAGNOSIS — E559 Vitamin D deficiency, unspecified: Secondary | ICD-10-CM

## 2013-12-11 DIAGNOSIS — I10 Essential (primary) hypertension: Secondary | ICD-10-CM

## 2013-12-11 DIAGNOSIS — Z789 Other specified health status: Secondary | ICD-10-CM

## 2013-12-11 DIAGNOSIS — E785 Hyperlipidemia, unspecified: Secondary | ICD-10-CM

## 2013-12-11 DIAGNOSIS — E538 Deficiency of other specified B group vitamins: Secondary | ICD-10-CM

## 2013-12-11 DIAGNOSIS — N400 Enlarged prostate without lower urinary tract symptoms: Secondary | ICD-10-CM

## 2013-12-11 MED ORDER — FINASTERIDE 1 MG PO TABS: 1.0000 mg | ORAL_TABLET | Freq: Every day | ORAL | Status: DC

## 2013-12-11 MED ORDER — VITAMIN D (ERGOCALCIFEROL) 1.25 MG (50000 UNIT) PO CAPS: ORAL_CAPSULE | ORAL | Status: DC

## 2013-12-11 NOTE — Patient Instructions (Addendum)
Continue current medications. Continue good therapeutic lifestyle changes which include good diet and exercise. Fall precautions discussed with patient. Schedule your flu vaccine if you haven't had it yet If you are over 54 years old - you may need Prevnar 69 or the adult Pneumonia vaccine. We will call you with the results of the PSA test once those results are available, continue your exercise program Be sure and check with your insurance regarding the Prevnar vaccine and the shingles vaccine

## 2013-12-11 NOTE — Progress Notes (Signed)
Subjective:    Patient ID: Brian Estes, male    DOB: Apr 08, 1961, 53 y.o.   MRN: [id number]  HPI Pt here for follow up and management of chronic medical problems. He is also here to get his annual physical exam. The patient has had laboratory done and this will be reviewed with him today. He was due for PSA, but refuses to have that done with additional lab work. He will get a prostate exam today and a chest x-ray today, he sees a gastroenterologist regularly and refuses to do an FOBT. His next colonoscopy is due in 2017. He will check with his insurance forgot the Prevnar vaccine.         Patient Active Problem List   Diagnosis Date Noted  . 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 VITAMIN B12 DEFICIENCY 05/24/2009  . HYPERLIPIDEMIA 05/21/2008  . CROHN'S DISEASE 05/21/2008  . DIVERTICULOSIS, COLON 05/21/2008  . CONSTIPATION 05/21/2008  . FATTY LIVER DISEASE 05/21/2008   Outpatient Encounter Prescriptions as of 12/11/2013  Medication Sig  . esomeprazole (NEXIUM) 40 MG capsule Take 40 mg by mouth daily at 12 noon.  . finasteride (PROPECIA) 1 MG tablet Take 1 mg by mouth daily.  . Vitamin D, Ergocalciferol, (DRISDOL) 50000 UNITS CAPS capsule TAKE ONE CAPSULE BY MOUTH WEEKLY  . [DISCONTINUED] Fexofenadine-Pseudoephedrine (ALLEGRA-D PO) Take by mouth as needed.    . [DISCONTINUED] Finasteride (PROPECIA PO) Take by mouth daily.   . [DISCONTINUED] losartan (COZAAR) 25 MG tablet Take 1 tablet (25 mg total) by mouth daily.  . [DISCONTINUED] mesalamine (PENTASA) 500 MG CR capsule Take 2 capsules (1,000 mg total) by mouth 2 (two) times daily.    Review of Systems  Constitutional: Negative.   HENT: Negative.   Eyes: Negative.   Respiratory: Negative.   Cardiovascular: Negative.   Gastrointestinal: Negative.   Endocrine: Negative.   Genitourinary: Negative.   Musculoskeletal: Negative.   Skin: Negative.   Allergic/Immunologic: Negative.   Neurological: Negative.   Hematological: Negative.     Psychiatric/Behavioral: Negative.        Objective:   Physical Exam  Nursing note and vitals reviewed. Constitutional: He is oriented to person, place, and time. He appears well-developed and well-nourished. No distress.  HENT:  Head: Normocephalic and atraumatic.  Right Ear: External ear normal.  Left Ear: External ear normal.  Nose: Nose normal.  Mouth/Throat: Oropharynx is clear and moist. No oropharyngeal exudate.  Eyes: Conjunctivae and EOM are normal. Pupils are equal, round, and reactive to light. Right eye exhibits no discharge. Left eye exhibits no discharge. No scleral icterus.  Neck: Normal range of motion. Neck supple. No tracheal deviation present. No thyromegaly present.  No carotid bruits  Cardiovascular: Normal rate, regular rhythm, normal heart sounds and intact distal pulses.  Exam reveals no gallop and no friction rub.   No murmur heard. At 72 per minute  Pulmonary/Chest: Effort normal and breath sounds normal. No respiratory distress. He has no wheezes. He has no rales. He exhibits no tenderness.  No axillary nodes  Abdominal: Soft. Bowel sounds are normal. He exhibits no mass. There is no tenderness. There is no rebound and no guarding.   No Epigastric tenderness  Genitourinary: Rectum normal and penis normal.  D. prostate exam revealed a slightly enlarged prostate with no nodules or masses. There were no rectal masses. There were no hernias palpated. There were no inguinal nodes. The external genitalia were normal  Musculoskeletal: Normal range of motion. He exhibits no edema and no tenderness.  Lymphadenopathy:    He has no cervical adenopathy.  Neurological: He is alert and oriented to person, place, and time. He has normal reflexes. No cranial nerve deficit.  Skin: Skin is warm and dry. No rash noted. No erythema. No pallor.  Psychiatric: He has a normal mood and affect. His behavior is normal. Judgment and thought content normal.   BP 127/84  Pulse 62   Temp(Src) 98.5 F (36.9 C) (Oral)  Ht 5' 9"  (1.753 m)  Wt 206 lb (93.441 kg)  BMI 30.41 kg/m2  [facility] reading (PRIMARY) by  Dr. Brunilda Payor x-ray --no active disease                                       Assessment & Plan:  1. HYPERLIPIDEMIA  2. VITAMIN B12 DEFICIENCY  3. Statin intolerance  4. BPH (benign prostatic hyperplasia) - PSA, total and free  5. Annual physical exam  6. Vitamin D deficiency    Meds ordered this encounter  Medications  . DISCONTD: finasteride (PROPECIA) 1 MG tablet    Sig: Take 1 mg by mouth daily.  Marland Kitchen esomeprazole (NEXIUM) 40 MG capsule    Sig: Take 40 mg by mouth daily at 12 noon.  . finasteride (PROPECIA) 1 MG tablet    Sig: Take 1 tablet (1 mg total) by mouth daily.    Dispense:  30 tablet    Refill:  6  . Vitamin D, Ergocalciferol, (DRISDOL) 50000 UNITS CAPS capsule    Sig: TAKE ONE CAPSULE BY MOUTH WEEKLY    Dispense:  4 capsule    Refill:  6   Patient Instructions  Continue current medications. Continue good therapeutic lifestyle changes which include good diet and exercise. Fall precautions discussed with patient. Schedule your flu vaccine if you haven't had it yet If you are over 69 years old - you may need Prevnar 68 or the adult Pneumonia vaccine. We will call you with the results of the PSA test once those results are available, continue your exercise program Be sure and check with your insurance regarding the Prevnar vaccine and the shingles vaccine       Arrie Senate MD

## 2013-12-12 LAB — PSA, TOTAL AND FREE
PSA, Free Pct: 22 %
PSA, Free: 0.11 ng/mL
PSA: 0.5 ng/mL (ref 0.0–4.0)

## 2013-12-12 NOTE — Addendum Note (Signed)
Addended by: Chipper Herb on: 12/12/2013 09:29 AM   Modules accepted: Level of Service

## 2014-06-13 ENCOUNTER — Other Ambulatory Visit (INDEPENDENT_AMBULATORY_CARE_PROVIDER_SITE_OTHER): Payer: BC Managed Care – PPO

## 2014-06-13 DIAGNOSIS — I10 Essential (primary) hypertension: Secondary | ICD-10-CM

## 2014-06-13 DIAGNOSIS — E559 Vitamin D deficiency, unspecified: Secondary | ICD-10-CM

## 2014-06-13 DIAGNOSIS — R5383 Other fatigue: Secondary | ICD-10-CM

## 2014-06-13 DIAGNOSIS — R5381 Other malaise: Secondary | ICD-10-CM

## 2014-06-13 LAB — POCT CBC
Granulocyte percent: 61.4 %G (ref 37–80)
HEMATOCRIT: 44.2 % (ref 43.5–53.7)
Hemoglobin: 15.3 g/dL (ref 14.1–18.1)
Lymph, poc: 2 (ref 0.6–3.4)
MCH: 31.2 pg (ref 27–31.2)
MCHC: 34.6 g/dL (ref 31.8–35.4)
MCV: 90 fL (ref 80–97)
MPV: 8 fL (ref 0–99.8)
POC Granulocyte: 3.8 (ref 2–6.9)
POC LYMPH PERCENT: 32.8 %L (ref 10–50)
Platelet Count, POC: 271 10*3/uL (ref 142–424)
RBC: 4.9 M/uL (ref 4.69–6.13)
RDW, POC: 12.7 %
WBC: 6.2 10*3/uL (ref 4.6–10.2)

## 2014-06-14 LAB — HEPATIC FUNCTION PANEL
ALT: 47 IU/L — ABNORMAL HIGH (ref 0–44)
AST: 35 IU/L (ref 0–40)
Albumin: 4.5 g/dL (ref 3.5–5.5)
Alkaline Phosphatase: 93 IU/L (ref 39–117)
Bilirubin, Direct: 0.25 mg/dL (ref 0.00–0.40)
Total Bilirubin: 1.2 mg/dL (ref 0.0–1.2)
Total Protein: 7.6 g/dL (ref 6.0–8.5)

## 2014-06-14 LAB — NMR, LIPOPROFILE
Cholesterol: 216 mg/dL — ABNORMAL HIGH (ref 100–199)
HDL Cholesterol by NMR: 33 mg/dL — ABNORMAL LOW (ref >39)
HDL Particle Number: 22.5 umol/L — ABNORMAL LOW (ref >=30.5)
LDL PARTICLE NUMBER: 1986 nmol/L — AB (ref <1000)
LDL Size: 20.2 nm (ref >20.5)
LDLC SERPL CALC-MCNC: 163 mg/dL — AB (ref 0–99)
LP-IR SCORE: 53 — AB (ref <=45)
Small LDL Particle Number: 1220 nmol/L — ABNORMAL HIGH (ref <=527)
Triglycerides by NMR: 99 mg/dL (ref 0–149)

## 2014-06-14 LAB — BMP8+EGFR
BUN/Creatinine Ratio: 14 (ref 9–20)
BUN: 13 mg/dL (ref 6–24)
CHLORIDE: 101 mmol/L (ref 97–108)
CO2: 26 mmol/L (ref 18–29)
Calcium: 9.9 mg/dL (ref 8.7–10.2)
Creatinine, Ser: 0.91 mg/dL (ref 0.76–1.27)
GFR calc non Af Amer: 96 mL/min/{1.73_m2} (ref >59)
GFR, EST AFRICAN AMERICAN: 111 mL/min/{1.73_m2} (ref >59)
Glucose: 87 mg/dL (ref 65–99)
POTASSIUM: 4.8 mmol/L (ref 3.5–5.2)
SODIUM: 142 mmol/L (ref 134–144)

## 2014-06-14 LAB — VITAMIN D 25 HYDROXY (VIT D DEFICIENCY, FRACTURES): Vit D, 25-Hydroxy: 35.3 ng/mL (ref 30.0–100.0)

## 2014-06-26 ENCOUNTER — Encounter: Payer: Self-pay | Admitting: Family Medicine

## 2014-06-26 ENCOUNTER — Ambulatory Visit (INDEPENDENT_AMBULATORY_CARE_PROVIDER_SITE_OTHER): Payer: BC Managed Care – PPO | Admitting: Family Medicine

## 2014-06-26 VITALS — BP 133/78 | HR 58 | Temp 97.8°F | Ht 69.0 in | Wt 202.2 lb

## 2014-06-26 DIAGNOSIS — E538 Deficiency of other specified B group vitamins: Secondary | ICD-10-CM

## 2014-06-26 DIAGNOSIS — E785 Hyperlipidemia, unspecified: Secondary | ICD-10-CM

## 2014-06-26 DIAGNOSIS — I10 Essential (primary) hypertension: Secondary | ICD-10-CM

## 2014-06-26 DIAGNOSIS — E559 Vitamin D deficiency, unspecified: Secondary | ICD-10-CM

## 2014-06-26 DIAGNOSIS — N400 Enlarged prostate without lower urinary tract symptoms: Secondary | ICD-10-CM

## 2014-06-26 DIAGNOSIS — K5090 Crohn's disease, unspecified, without complications: Secondary | ICD-10-CM

## 2014-06-26 DIAGNOSIS — K7689 Other specified diseases of liver: Secondary | ICD-10-CM

## 2014-06-26 DIAGNOSIS — Z789 Other specified health status: Secondary | ICD-10-CM

## 2014-06-26 MED ORDER — LOSARTAN POTASSIUM 25 MG PO TABS: 25.0000 mg | ORAL_TABLET | Freq: Every day | ORAL | Status: DC

## 2014-06-26 NOTE — Progress Notes (Signed)
Subjective:    Patient ID: Brian Estes, male    DOB: August 04, 1961, 53 y.o.   MRN: [id number]  HPI Pt is here today for follow up of chronic medical problems which include hypertension,hyperlipidemia, GERD and Vit D deficiency. The patient is doing well. He has no particular complaints. He is taking his blood Sartin again 25 mg. He would like a prescription called in for this.        Patient Active Problem List   Diagnosis Date Noted  . Statin intolerance 12/11/2013  . BPH (benign prostatic hyperplasia) 12/11/2013  . Vitamin D deficiency 12/11/2013  . 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 VITAMIN B12 DEFICIENCY 05/24/2009  . HYPERLIPIDEMIA 05/21/2008  . CROHN'S DISEASE 05/21/2008  . DIVERTICULOSIS, COLON 05/21/2008  . CONSTIPATION 05/21/2008  . FATTY LIVER DISEASE 05/21/2008   Outpatient Encounter Prescriptions as of 06/26/2014  Medication Sig  . esomeprazole (NEXIUM) 40 MG capsule Take 40 mg by mouth daily at 12 noon.  . finasteride (PROPECIA) 1 MG tablet Take 1 tablet (1 mg total) by mouth daily.  Marland Kitchen losartan (COZAAR) 25 MG tablet Take 1 tablet (25 mg total) by mouth daily.  . Vitamin D, Ergocalciferol, (DRISDOL) 50000 UNITS CAPS capsule TAKE ONE CAPSULE BY MOUTH WEEKLY  . [DISCONTINUED] losartan (COZAAR) 25 MG tablet Take 25 mg by mouth daily.      Review of Systems  Constitutional: Negative for fatigue.  HENT: Negative.   Respiratory: Negative for cough and shortness of breath.   Cardiovascular: Negative.   Gastrointestinal: Negative.   Musculoskeletal: Negative.        Objective:   Physical Exam  Nursing note and vitals reviewed. Constitutional: He is oriented to person, place, and time. He appears well-developed and well-nourished. No distress.  HENT:  Head: Normocephalic and atraumatic.  Right Ear: External ear normal.  Left Ear: External ear normal.  Nose: Nose normal.  Mouth/Throat: Oropharynx is clear and moist. No oropharyngeal exudate.  Eyes: Conjunctivae  and EOM are normal. Pupils are equal, round, and reactive to light. Right eye exhibits no discharge. Left eye exhibits no discharge. No scleral icterus.  Neck: Normal range of motion. Neck supple. No thyromegaly present.  No carotid bruits  Cardiovascular: Normal rate, regular rhythm, normal heart sounds and intact distal pulses.  Exam reveals no gallop and no friction rub.   No murmur heard. 60 per minute  Pulmonary/Chest: Effort normal and breath sounds normal. No respiratory distress. He has no wheezes. He has no rales. He exhibits no tenderness.  No axillary nodes  Abdominal: Soft. Bowel sounds are normal. He exhibits no mass. There is no tenderness. There is no rebound and no guarding.  No abdominal bruits or masses or tenderness. No inguinal nodes  Musculoskeletal: Normal range of motion. He exhibits no edema and no tenderness.  Lymphadenopathy:    He has no cervical adenopathy.  Neurological: He is alert and oriented to person, place, and time. He has normal reflexes. No cranial nerve deficit.  Skin: Skin is warm and dry. No rash noted. No erythema. No pallor.  Psychiatric: He has a normal mood and affect. His behavior is normal. Judgment and thought content normal.   BP 133/78  Pulse 58  Temp(Src) 97.8 F (36.6 C) (Oral)  Ht 5' 9"  (1.753 m)  Wt 202 lb 3.2 oz (91.717 kg)  BMI 29.85 kg/m2       Assessment & Plan:   1. HYPERLIPIDEMIA  2. Statin intolerance  3. CROHN'S DISEASE  4. Vitamin D  deficiency  5. VITAMIN B12 DEFICIENCY  6. BPH (benign prostatic hyperplasia)  7. FATTY LIVER DISEASE  8. Essential hypertension -Resume losartan and continue taking this at 1 daily 25 mg  Meds ordered this encounter  Medications  . DISCONTD: losartan (COZAAR) 25 MG tablet    Sig: Take 25 mg by mouth daily.  Marland Kitchen losartan (COZAAR) 25 MG tablet    Sig: Take 1 tablet (25 mg total) by mouth daily.    Dispense:  90 tablet    Refill:  1   Patient Instructions  Continue current  medications. Continue good therapeutic lifestyle changes which include good diet and exercise. Fall precautions discussed with patient. If an FOBT was given today- please return it to our front desk. If you are over 45 years old - you may need Prevnar 69 or the adult Pneumonia vaccine.  Flu Shots will be available at our office starting mid- September. Please call and schedule a FLU CLINIC APPOINTMENT.   Continue to watch sodium intake and exercise regularly to keep weight down Check with your insurance regarding the Prevnar vaccine Return FOBT   Arrie Senate MD

## 2014-06-26 NOTE — Patient Instructions (Addendum)
Continue current medications. Continue good therapeutic lifestyle changes which include good diet and exercise. Fall precautions discussed with patient. If an FOBT was given today- please return it to our front desk. If you are over 54 years old - you may need Prevnar 12 or the adult Pneumonia vaccine.  Flu Shots will be available at our office starting mid- September. Please call and schedule a FLU CLINIC APPOINTMENT.   Continue to watch sodium intake and exercise regularly to keep weight down Check with your insurance regarding the Prevnar vaccine Return FOBT

## 2014-07-08 ENCOUNTER — Other Ambulatory Visit: Payer: Self-pay | Admitting: Family Medicine

## 2014-07-18 ENCOUNTER — Encounter: Payer: Self-pay | Admitting: Gastroenterology

## 2014-10-05 ENCOUNTER — Other Ambulatory Visit: Payer: Self-pay | Admitting: Family Medicine

## 2014-10-27 IMAGING — CR DG CHEST 2V
2 series · 2 of 2 positions shown · non-contrast
Comparison: [HOSPITAL] CT Abdomen and Pelvis 12/18/2006.

CLINICAL DATA: 52-year-old male with hypertension. Kidney disease.
History of diverticulosis/colitis. Initial encounter.

EXAM:
CHEST  2 VIEW

[view not recorded (1 of 2)]
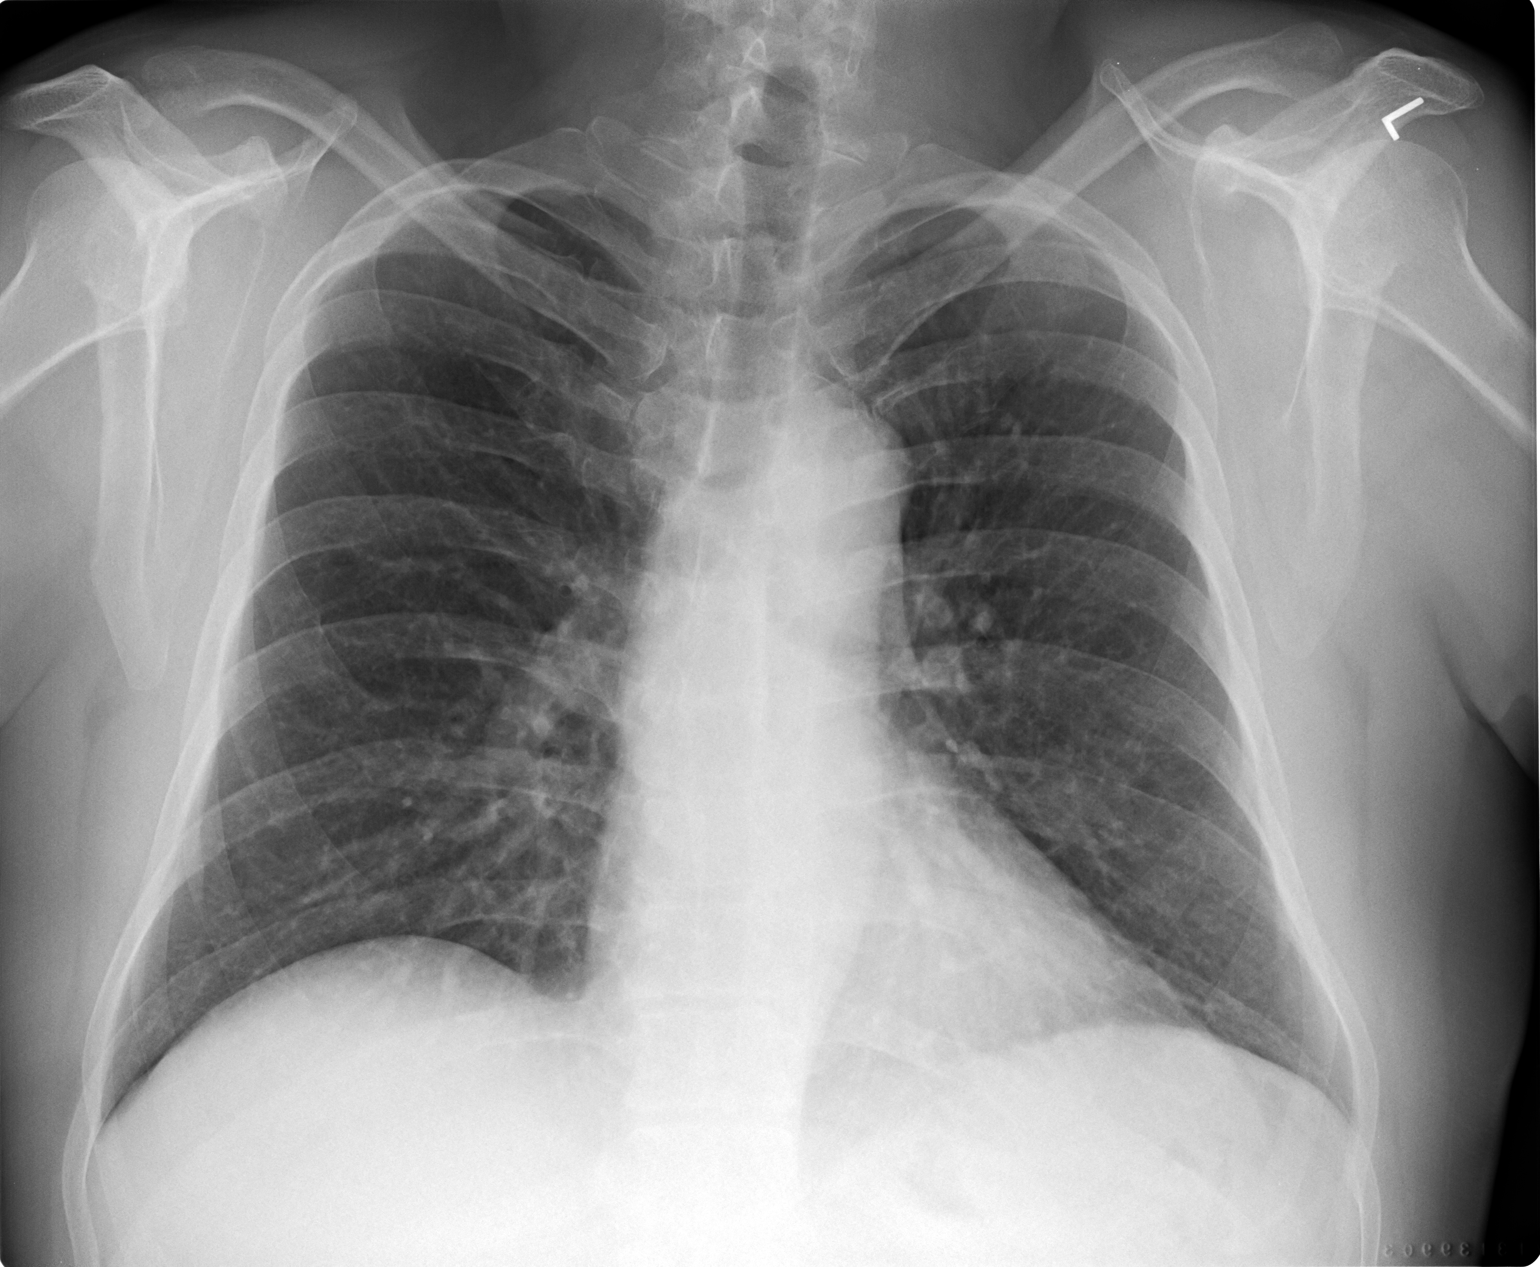

[view not recorded (2 of 2)]
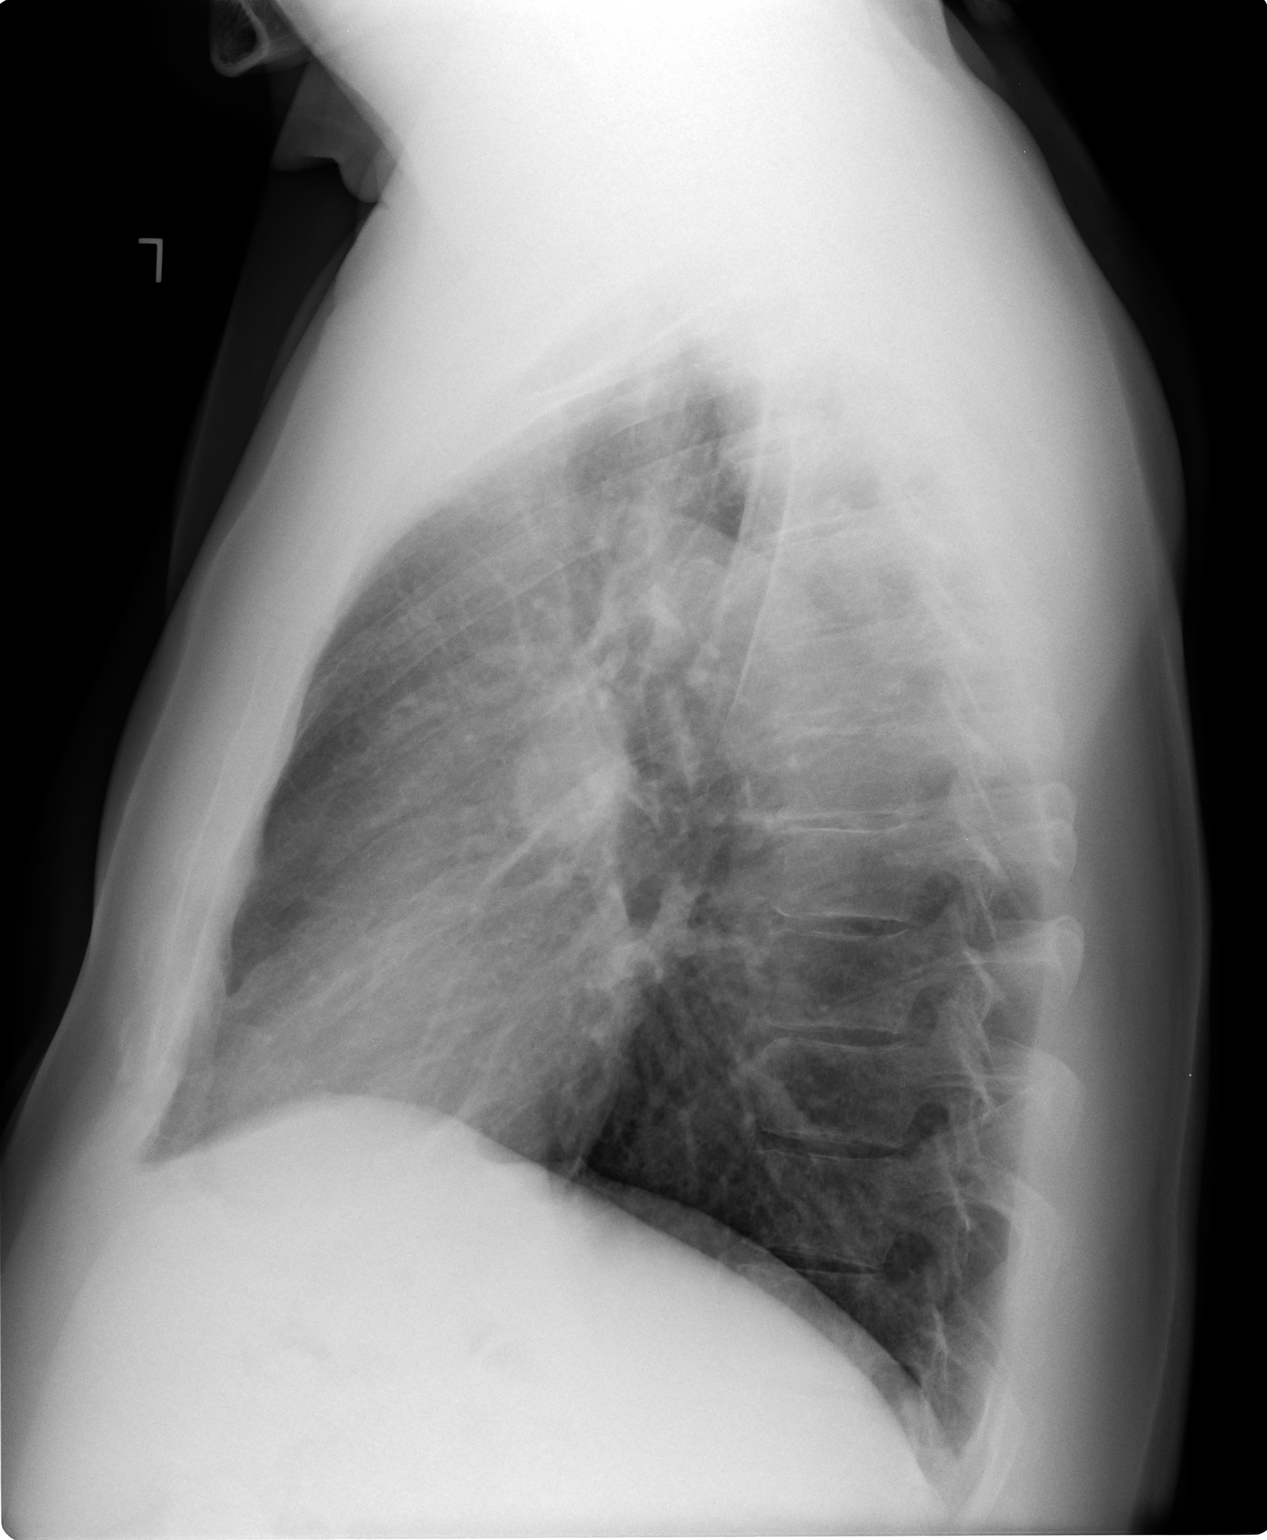

[2 of 2 positions shown; findings below may reference images not displayed]

FINDINGS: Stable or less pronounced mild elevation of the right hemidiaphragm.
Normal cardiac size and mediastinal contours. Visualized tracheal
air column is within normal limits. The lungs are clear. No
pneumothorax or effusion. No acute osseous abnormality identified.
IMPRESSION: Negative, no acute cardiopulmonary abnormality.

## 2014-12-12 ENCOUNTER — Other Ambulatory Visit: Payer: Self-pay

## 2014-12-17 ENCOUNTER — Other Ambulatory Visit (INDEPENDENT_AMBULATORY_CARE_PROVIDER_SITE_OTHER): Payer: BLUE CROSS/BLUE SHIELD

## 2014-12-17 DIAGNOSIS — M791 Myalgia, unspecified site: Secondary | ICD-10-CM

## 2014-12-17 DIAGNOSIS — Z789 Other specified health status: Secondary | ICD-10-CM

## 2014-12-17 DIAGNOSIS — N400 Enlarged prostate without lower urinary tract symptoms: Secondary | ICD-10-CM

## 2014-12-17 DIAGNOSIS — E559 Vitamin D deficiency, unspecified: Secondary | ICD-10-CM

## 2014-12-17 DIAGNOSIS — I10 Essential (primary) hypertension: Secondary | ICD-10-CM

## 2014-12-17 DIAGNOSIS — E785 Hyperlipidemia, unspecified: Secondary | ICD-10-CM

## 2014-12-17 DIAGNOSIS — Z889 Allergy status to unspecified drugs, medicaments and biological substances status: Secondary | ICD-10-CM

## 2014-12-17 LAB — POCT CBC
Granulocyte percent: 50.1 %G (ref 37–80)
HCT, POC: 46.4 % (ref 43.5–53.7)
HEMOGLOBIN: 14.8 g/dL (ref 14.1–18.1)
Lymph, poc: 2.6 (ref 0.6–3.4)
MCH, POC: 29.3 pg (ref 27–31.2)
MCHC: 31.8 g/dL (ref 31.8–35.4)
MCV: 92.1 fL (ref 80–97)
MPV: 8.4 fL (ref 0–99.8)
PLATELET COUNT, POC: 324 10*3/uL (ref 142–424)
POC Granulocyte: 3 (ref 2–6.9)
POC LYMPH %: 43.5 % (ref 10–50)
RBC: 5 M/uL (ref 4.69–6.13)
RDW, POC: 12.8 %
WBC: 5.9 10*3/uL (ref 4.6–10.2)

## 2014-12-18 LAB — HEPATIC FUNCTION PANEL
ALBUMIN: 4.2 g/dL (ref 3.5–5.5)
ALT: 51 IU/L — ABNORMAL HIGH (ref 0–44)
AST: 37 IU/L (ref 0–40)
Alkaline Phosphatase: 81 IU/L (ref 39–117)
Bilirubin Total: 0.7 mg/dL (ref 0.0–1.2)
Bilirubin, Direct: 0.16 mg/dL (ref 0.00–0.40)
TOTAL PROTEIN: 7.4 g/dL (ref 6.0–8.5)

## 2014-12-18 LAB — BMP8+EGFR
BUN/Creatinine Ratio: 18 (ref 9–20)
BUN: 15 mg/dL (ref 6–24)
CALCIUM: 9.5 mg/dL (ref 8.7–10.2)
CHLORIDE: 102 mmol/L (ref 97–108)
CO2: 26 mmol/L (ref 18–29)
Creatinine, Ser: 0.84 mg/dL (ref 0.76–1.27)
GFR calc Af Amer: 115 mL/min/{1.73_m2} (ref >59)
GFR calc non Af Amer: 99 mL/min/{1.73_m2} (ref >59)
GLUCOSE: 86 mg/dL (ref 65–99)
POTASSIUM: 4.4 mmol/L (ref 3.5–5.2)
Sodium: 141 mmol/L (ref 134–144)

## 2014-12-18 LAB — PSA, TOTAL AND FREE
PSA, Free Pct: 15.7 %
PSA, Free: 0.11 ng/mL
PSA: 0.7 ng/mL (ref 0.0–4.0)

## 2014-12-18 LAB — NMR, LIPOPROFILE
Cholesterol: 223 mg/dL — ABNORMAL HIGH (ref 100–199)
HDL Cholesterol by NMR: 31 mg/dL — ABNORMAL LOW (ref >39)
HDL Particle Number: 23.4 umol/L — ABNORMAL LOW (ref >=30.5)
LDL PARTICLE NUMBER: 2044 nmol/L — AB (ref <1000)
LDL Size: 19.9 nm (ref >20.5)
LDL-C: 158 mg/dL — AB (ref 0–99)
LP-IR Score: 59 — ABNORMAL HIGH (ref <=45)
Small LDL Particle Number: 1503 nmol/L — ABNORMAL HIGH (ref <=527)
TRIGLYCERIDES BY NMR: 169 mg/dL — AB (ref 0–149)

## 2014-12-18 LAB — VITAMIN D 25 HYDROXY (VIT D DEFICIENCY, FRACTURES): Vit D, 25-Hydroxy: 30 ng/mL (ref 30.0–100.0)

## 2014-12-27 ENCOUNTER — Encounter: Payer: Self-pay | Admitting: Family Medicine

## 2014-12-27 ENCOUNTER — Ambulatory Visit (INDEPENDENT_AMBULATORY_CARE_PROVIDER_SITE_OTHER): Payer: BLUE CROSS/BLUE SHIELD | Admitting: Family Medicine

## 2014-12-27 VITALS — BP 142/80 | HR 59 | Temp 98.5°F | Ht 69.0 in | Wt 203.0 lb

## 2014-12-27 DIAGNOSIS — Z0000 Encounter for general adult medical examination without abnormal findings: Secondary | ICD-10-CM

## 2014-12-27 DIAGNOSIS — L570 Actinic keratosis: Secondary | ICD-10-CM

## 2014-12-27 DIAGNOSIS — E538 Deficiency of other specified B group vitamins: Secondary | ICD-10-CM

## 2014-12-27 DIAGNOSIS — E559 Vitamin D deficiency, unspecified: Secondary | ICD-10-CM

## 2014-12-27 DIAGNOSIS — I10 Essential (primary) hypertension: Secondary | ICD-10-CM

## 2014-12-27 DIAGNOSIS — N400 Enlarged prostate without lower urinary tract symptoms: Secondary | ICD-10-CM

## 2014-12-27 DIAGNOSIS — Z789 Other specified health status: Secondary | ICD-10-CM

## 2014-12-27 DIAGNOSIS — H16131 Photokeratitis, right eye: Secondary | ICD-10-CM

## 2014-12-27 DIAGNOSIS — E785 Hyperlipidemia, unspecified: Secondary | ICD-10-CM

## 2014-12-27 MED ORDER — LOSARTAN POTASSIUM 25 MG PO TABS: 25.0000 mg | ORAL_TABLET | Freq: Every day | ORAL | Status: DC

## 2014-12-27 MED ORDER — FINASTERIDE 1 MG PO TABS: ORAL_TABLET | ORAL | Status: DC

## 2014-12-27 MED ORDER — VITAMIN D (ERGOCALCIFEROL) 1.25 MG (50000 UNIT) PO CAPS: 50000.0000 [IU] | ORAL_CAPSULE | ORAL | Status: DC

## 2014-12-27 NOTE — Patient Instructions (Addendum)
Continue current medications. Continue good therapeutic lifestyle changes which include good diet and exercise. Fall precautions discussed with patient. If an FOBT was given today- please return it to our front desk. If you are over 55 years old - you may need Prevnar 55 or the adult Pneumonia vaccine.  Flu Shots are still available at our office. If you still haven't had one please call to set up a nurse visit to get one.   After your visit with [place] today you will receive a survey in the mail or online from [employer] regarding your care with [place]. Please take a moment to fill this out. Your feedback is very important to [place] as you can help [place] better understand your patient needs as well as improve your experience and satisfaction. WE CARE ABOUT YOU!!!   The patient should consider one of the new PC [place] 9 inhibitors--- I gave him information about this medication and he will read it over and think about it. The patient needs to check with his insurance regarding the Prevnar vaccine and the shingles vaccine He should continue to exercise and walk and watch his diet as closely as possible since he is unable to take statin drugs. Continue to drink plenty of fluids Continue to take vitamin D 50,000 units weekly

## 2014-12-27 NOTE — Progress Notes (Signed)
Subjective:    Patient ID: Brian Estes, male    DOB: 09/25/1961, 54 y.o.   MRN: [id number]  HPI Patient is here today for annual wellness exam and follow up of chronic medical problems which includes hypertension and hyperlipidemia. He is taking medications regularly.       Patient Active Problem List   Diagnosis Date Noted  . Essential hypertension 06/26/2014  . Statin intolerance 12/11/2013  . BPH (benign prostatic hyperplasia) 12/11/2013  . Vitamin D deficiency 12/11/2013  . 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 VITAMIN B12 DEFICIENCY 05/24/2009  . HYPERLIPIDEMIA 05/21/2008  . CROHN'S DISEASE 05/21/2008  . DIVERTICULOSIS, COLON 05/21/2008  . CONSTIPATION 05/21/2008  . FATTY LIVER DISEASE 05/21/2008   Outpatient Encounter Prescriptions as of 12/27/2014  Medication Sig  . esomeprazole (NEXIUM) 40 MG capsule Take 40 mg by mouth daily at 12 noon.  . finasteride (PROPECIA) 1 MG tablet TAKE 1 TABLET (1 MG TOTAL) BY MOUTH DAILY.  Marland Kitchen KRILL OIL PO Take 1 capsule by mouth daily.  Marland Kitchen losartan (COZAAR) 25 MG tablet Take 1 tablet (25 mg total) by mouth daily.  . Vitamin D, Ergocalciferol, (DRISDOL) 50000 UNITS CAPS capsule TAKE ONE CAPSULE BY MOUTH WEEKLY    Review of Systems  Constitutional: Negative.   HENT: Negative.   Eyes: Negative.   Respiratory: Negative.   Cardiovascular: Negative.   Gastrointestinal: Negative.   Endocrine: Negative.   Genitourinary: Negative.   Musculoskeletal: Negative.   Skin: Negative.        Skin lesion to right temple  Allergic/Immunologic: Negative.   Neurological: Negative.   Hematological: Negative.   Psychiatric/Behavioral: Negative.        Objective:   Physical Exam  Constitutional: He is oriented to person, place, and time. He appears well-developed and well-nourished.  HENT:  Head: Normocephalic and atraumatic.  Right Ear: External ear normal.  Left Ear: External ear normal.  Nose: Nose normal.  Mouth/Throat: Oropharynx is clear and  moist. No oropharyngeal exudate.  Eyes: Conjunctivae and EOM are normal. Pupils are equal, round, and reactive to light. Right eye exhibits no discharge. Left eye exhibits no discharge. No scleral icterus.  Neck: Normal range of motion. Neck supple. No thyromegaly present.  There are no anterior cervical nodes or carotid bruits  Cardiovascular: Normal rate, regular rhythm, normal heart sounds and intact distal pulses.  Exam reveals no gallop and no friction rub.   No murmur heard. The heart had a regular rate and rhythm at 60/m.  Pulmonary/Chest: Effort normal and breath sounds normal. No respiratory distress. He has no wheezes. He has no rales. He exhibits no tenderness.  Lungs are clear anteriorly and posteriorly and there didn't no axillary adenopathy.  Abdominal: Soft. Bowel sounds are normal. He exhibits no mass. There is no tenderness. There is no rebound and no guarding.  The abdomen is soft without organ enlargement or masses or inguinal nodes  Genitourinary: Rectum normal and penis normal.  The prostate is slightly enlarged with no lumps or masses and there are no rectal masses. The external genitalia were within normal limits with no hernias palpated. The testicles were normal.  Musculoskeletal: Normal range of motion. He exhibits no edema or tenderness.  Lymphadenopathy:    He has no cervical adenopathy.  Neurological: He is alert and oriented to person, place, and time. He has normal reflexes. No cranial nerve deficit.  Skin: Skin is warm and dry. No rash noted. No erythema. No pallor.  There is an actinic keratosis on  the right temple area and this was treated with cryotherapy without complication or problem and the patient tolerated this procedure well.  Psychiatric: He has a normal mood and affect. His behavior is normal. Judgment and thought content normal.  Nursing note and vitals reviewed.  BP 142/80 mmHg  Pulse 59  Temp(Src) 98.5 F (36.9 C) (Oral)  Ht 5' 9"  (1.753 m)   Wt 203 lb (92.08 kg)  BMI 29.96 kg/m2  EKG: Marland Kitchen The EKG had sinus bradycardia with nonspecific T-wave abnormalities. We will try to find an old one for comparison.       Assessment & Plan:  1. Vitamin D deficiency -Continue vitamin D D 50,000 units weekly  2. BPH (benign prostatic hyperplasia) -We will continue to monitor this yearly with PSA and digital rectal exam.  3. Essential hypertension -Home blood pressures have been good, he should continue with his current treatment.  4. Annual physical exam -Lab work was reviewed with patient today and all lab parameters were good except for his very elevated  LDL particle number and LDL C -The patient should continue with his aggressive therapeutic lifestyle changes which include diet and exercise  5. Hyperlipidemia -He is going to read over the information about the new PC SK 9 inhibitors  6. Statin intolerance -Continue aggressive therapeutic lifestyle changes  7. B12 deficiency -Continue current treatment  8. Actinic keratitis, right -Cryotherapy was performed on this lesion without problems.  Meds ordered this encounter  Medications  . KRILL OIL PO    Sig: Take 1 capsule by mouth daily.  Marland Kitchen losartan (COZAAR) 25 MG tablet    Sig: Take 1 tablet (25 mg total) by mouth daily.    Dispense:  90 tablet    Refill:  3  . finasteride (PROPECIA) 1 MG tablet    Sig: TAKE 1 TABLET (1 MG TOTAL) BY MOUTH DAILY.    Dispense:  90 tablet    Refill:  3  . Vitamin D, Ergocalciferol, (DRISDOL) 50000 UNITS CAPS capsule    Sig: Take 1 capsule (50,000 Units total) by mouth once a week.    Dispense:  12 capsule    Refill:  3   Patient Instructions  Continue current medications. Continue good therapeutic lifestyle changes which include good diet and exercise. Fall precautions discussed with patient. If an FOBT was given today- please return it to our front desk. If you are over 35 years old - you may need Prevnar 41 or the adult Pneumonia  vaccine.  Flu Shots are still available at our office. If you still haven't had one please call to set up a nurse visit to get one.   After your visit with [place] today you will receive a survey in the mail or online from [employer] regarding your care with [place]. Please take a moment to fill this out. Your feedback is very important to [place] as you can help [place] better understand your patient needs as well as improve your experience and satisfaction. WE CARE ABOUT YOU!!!   The patient should consider one of the new PC KS 9 inhibitors--- I gave him information about this medication and he will read it over and think about it. The patient needs to check with his insurance regarding the Prevnar vaccine and the shingles vaccine He should continue to exercise and walk and watch his diet as closely as possible since he is unable to take statin drugs. Continue to drink plenty of fluids Continue to take vitamin D 50,000  units weekly    Arrie Senate MD

## 2015-05-18 ENCOUNTER — Ambulatory Visit (INDEPENDENT_AMBULATORY_CARE_PROVIDER_SITE_OTHER): Payer: BLUE CROSS/BLUE SHIELD | Admitting: Physician Assistant

## 2015-05-18 VITALS — BP 145/96 | HR 68 | Temp 98.5°F | Ht 69.0 in | Wt 201.4 lb

## 2015-05-18 DIAGNOSIS — J0120 Acute ethmoidal sinusitis, unspecified: Secondary | ICD-10-CM | POA: Diagnosis not present

## 2015-05-18 MED ORDER — CEFTRIAXONE SODIUM 1 G IJ SOLR
1.0000 g | Freq: Once | INTRAMUSCULAR | Status: AC
  Administered 2015-05-18: 1 g via INTRAMUSCULAR

## 2015-05-18 MED ORDER — FLUTICASONE PROPIONATE 50 MCG/ACT NA SUSP: 2.0000 | Freq: Every day | NASAL | Status: DC

## 2015-05-18 NOTE — Patient Instructions (Signed)
Upper Respiratory Infection, Adult An upper respiratory infection (URI) is also sometimes known as the common cold. The upper respiratory tract includes the nose, sinuses, throat, trachea, and bronchi. Bronchi are the airways leading to the lungs. Most people improve within 1 week, but symptoms can last up to 2 weeks. A residual cough may last even longer.  CAUSES Many different viruses can infect the tissues lining the upper respiratory tract. The tissues become irritated and inflamed and often become very moist. Mucus production is also common. A cold is contagious. You can easily spread the virus to others by oral contact. This includes kissing, sharing a glass, coughing, or sneezing. Touching your mouth or nose and then touching a surface, which is then touched by another person, can also spread the virus. SYMPTOMS  Symptoms typically develop 1 to 3 days after you come in contact with a cold virus. Symptoms vary from person to person. They may include:  Runny nose.  Sneezing.  Nasal congestion.  Sinus irritation.  Sore throat.  Loss of voice (laryngitis).  Cough.  Fatigue.  Muscle aches.  Loss of appetite.  Headache.  Low-grade fever. DIAGNOSIS  You might diagnose your own cold based on familiar symptoms, since most people get a cold 2 to 3 times a year. Your caregiver can confirm this based on your exam. Most importantly, your caregiver can check that your symptoms are not due to another disease such as strep throat, sinusitis, pneumonia, asthma, or epiglottitis. Blood tests, throat tests, and X-rays are not necessary to diagnose a common cold, but they may sometimes be helpful in excluding other more serious diseases. Your caregiver will decide if any further tests are required. RISKS AND COMPLICATIONS  You may be at risk for a more severe case of the common cold if you smoke cigarettes, have chronic heart disease (such as heart failure) or lung disease (such as asthma), or if  you have a weakened immune system. The very young and very old are also at risk for more serious infections. Bacterial sinusitis, middle ear infections, and bacterial pneumonia can complicate the common cold. The common cold can worsen asthma and chronic obstructive pulmonary disease (COPD). Sometimes, these complications can require emergency medical care and may be life-threatening. PREVENTION  The best way to protect against getting a cold is to practice good hygiene. Avoid oral or hand contact with people with cold symptoms. Wash your hands often if contact occurs. There is no clear evidence that vitamin C, vitamin E, echinacea, or exercise reduces the chance of developing a cold. However, it is always recommended to get plenty of rest and practice good nutrition. TREATMENT  Treatment is directed at relieving symptoms. There is no cure. Antibiotics are not effective, because the infection is caused by a virus, not by bacteria. Treatment may include:  Increased fluid intake. Sports drinks offer valuable electrolytes, sugars, and fluids.  Breathing heated mist or steam (vaporizer or shower).  Eating chicken soup or other clear broths, and maintaining good nutrition.  Getting plenty of rest.  Using gargles or lozenges for comfort.  Controlling fevers with ibuprofen or acetaminophen as directed by your caregiver.  Increasing usage of your inhaler if you have asthma. Zinc gel and zinc lozenges, taken in the first 24 hours of the common cold, can shorten the duration and lessen the severity of symptoms. Pain medicines may help with fever, muscle aches, and throat pain. A variety of non-prescription medicines are available to treat congestion and runny nose. Your caregiver   can make recommendations and may suggest nasal or lung inhalers for other symptoms.  HOME CARE INSTRUCTIONS   Only take over-the-counter or prescription medicines for pain, discomfort, or fever as directed by your  caregiver.  Use a warm mist humidifier or inhale steam from a shower to increase air moisture. This may keep secretions moist and make it easier to breathe.  Drink enough water and fluids to keep your urine clear or pale yellow.  Rest as needed.  Return to work when your temperature has returned to normal or as your caregiver advises. You may need to stay home longer to avoid infecting others. You can also use a face mask and careful hand washing to prevent spread of the virus. SEEK MEDICAL CARE IF:   After the first few days, you feel you are getting worse rather than better.  You need your caregiver's advice about medicines to control symptoms.  You develop chills, worsening shortness of breath, or brown or red sputum. These may be signs of pneumonia.  You develop yellow or brown nasal discharge or pain in the face, especially when you bend forward. These may be signs of sinusitis.  You develop a fever, swollen neck glands, pain with swallowing, or white areas in the back of your throat. These may be signs of strep throat. SEEK IMMEDIATE MEDICAL CARE IF:   You have a fever.  You develop severe or persistent headache, ear pain, sinus pain, or chest pain.  You develop wheezing, a prolonged cough, cough up blood, or have a change in your usual mucus (if you have chronic lung disease).  You develop sore muscles or a stiff neck. Document Released: 04/21/2001 Document Revised: 01/18/2012 Document Reviewed: 01/31/2014 Exit[employer] Patient Information 2015 [employer]. This information is not intended to replace advice given to you by your health care provider. Make sure you discuss any questions you have with your health care provider.  

## 2015-05-18 NOTE — Progress Notes (Signed)
   Subjective:    Patient ID: Brian Estes, male    DOB: 15-Apr-1961, 54 y.o.   MRN: [id number]  HPI 54 y/o male with comorbid Chrohns disease presents with c/o nasal and head congestion x 4 days. Has took sudafed with some relief, however he has htn and this is making his BP go up.     Review of Systems  Constitutional: Negative for fever, chills, diaphoresis and fatigue.  HENT: Positive for congestion (nasal congestion ), rhinorrhea and sinus pressure. Negative for ear pain, postnasal drip and sore throat.   Eyes: Negative.   Respiratory: Negative for shortness of breath. Cough: initially , intermittently productive    Cardiovascular: Negative for leg swelling.  Gastrointestinal: Negative.   All other systems reviewed and are negative.      Objective:   Physical Exam  Constitutional: He appears well-developed and well-nourished.  HENT:  Head: Normocephalic and atraumatic.  Right Ear: External ear normal.  Left Ear: External ear normal.  Injected posterior pharynx Nasal turbinates erythematous and edematous  Cardiovascular: Normal rate, regular rhythm and normal heart sounds.  Exam reveals no gallop and no friction rub.   No murmur heard. Pulmonary/Chest: Effort normal and breath sounds normal. No respiratory distress. He has no wheezes. He has no rales. He exhibits no tenderness.  Psychiatric: He has a normal mood and affect. His behavior is normal. Judgment and thought content normal.  Nursing note and vitals reviewed.         Assessment & Plan:  1. Acute ethmoidal sinusitis, recurrence not specified - Patient does not do well with pills due to the fact that it aggravates his Crohns Disease. He was very hesitant to take PO antibiotics - cefTRIAXone (ROCEPHIN) injection 1 g; Inject 1 g into the muscle once. - fluticasone (FLONASE) 50 MCG/ACT nasal spray; Place 2 sprays into both nostrils daily.  Dispense: 16 g; Refill: 6 - OTC plain musinex  RTO if s/s worsen or do noti  improve   Brian Estes A. Benjamin Stain PA-C

## 2015-06-20 ENCOUNTER — Other Ambulatory Visit: Payer: BLUE CROSS/BLUE SHIELD

## 2015-06-25 ENCOUNTER — Other Ambulatory Visit (INDEPENDENT_AMBULATORY_CARE_PROVIDER_SITE_OTHER): Payer: BLUE CROSS/BLUE SHIELD

## 2015-06-25 DIAGNOSIS — E559 Vitamin D deficiency, unspecified: Secondary | ICD-10-CM

## 2015-06-25 DIAGNOSIS — E785 Hyperlipidemia, unspecified: Secondary | ICD-10-CM

## 2015-06-25 DIAGNOSIS — N400 Enlarged prostate without lower urinary tract symptoms: Secondary | ICD-10-CM

## 2015-06-25 DIAGNOSIS — I10 Essential (primary) hypertension: Secondary | ICD-10-CM

## 2015-06-25 NOTE — Addendum Note (Signed)
Addended by: Earlene Plater on: 06/25/2015 09:07 AM   Modules accepted: Orders

## 2015-06-25 NOTE — Progress Notes (Signed)
Labs only

## 2015-06-26 LAB — CBC WITH DIFFERENTIAL/PLATELET
BASOS: 1 %
Basophils Absolute: 0.1 10*3/uL (ref 0.0–0.2)
EOS (ABSOLUTE): 0.1 10*3/uL (ref 0.0–0.4)
EOS: 2 %
HEMATOCRIT: 44.2 % (ref 37.5–51.0)
Hemoglobin: 15.2 g/dL (ref 12.6–17.7)
Immature Grans (Abs): 0 10*3/uL (ref 0.0–0.1)
Immature Granulocytes: 0 %
LYMPHS ABS: 1.8 10*3/uL (ref 0.7–3.1)
Lymphs: 32 %
MCH: 30.8 pg (ref 26.6–33.0)
MCHC: 34.4 g/dL (ref 31.5–35.7)
MCV: 90 fL (ref 79–97)
MONOS ABS: 0.6 10*3/uL (ref 0.1–0.9)
Monocytes: 10 %
NEUTROS ABS: 3.1 10*3/uL (ref 1.4–7.0)
Neutrophils: 55 %
PLATELETS: 305 10*3/uL (ref 150–379)
RBC: 4.94 x10E6/uL (ref 4.14–5.80)
RDW: 13.1 % (ref 12.3–15.4)
WBC: 5.6 10*3/uL (ref 3.4–10.8)

## 2015-06-26 LAB — BMP8+EGFR
BUN / CREAT RATIO: 14 (ref 9–20)
BUN: 13 mg/dL (ref 6–24)
CHLORIDE: 102 mmol/L (ref 97–108)
CO2: 24 mmol/L (ref 18–29)
CREATININE: 0.96 mg/dL (ref 0.76–1.27)
Calcium: 9.4 mg/dL (ref 8.7–10.2)
GFR calc Af Amer: 103 mL/min/{1.73_m2} (ref >59)
GFR calc non Af Amer: 89 mL/min/{1.73_m2} (ref >59)
GLUCOSE: 89 mg/dL (ref 65–99)
Potassium: 4 mmol/L (ref 3.5–5.2)
SODIUM: 142 mmol/L (ref 134–144)

## 2015-06-26 LAB — HEPATIC FUNCTION PANEL
ALT: 37 IU/L (ref 0–44)
AST: 29 IU/L (ref 0–40)
Albumin: 4.2 g/dL (ref 3.5–5.5)
Alkaline Phosphatase: 88 IU/L (ref 39–117)
Bilirubin Total: 0.9 mg/dL (ref 0.0–1.2)
Bilirubin, Direct: 0.2 mg/dL (ref 0.00–0.40)
TOTAL PROTEIN: 7.4 g/dL (ref 6.0–8.5)

## 2015-06-26 LAB — LIPID PANEL
CHOL/HDL RATIO: 6.3 ratio — AB (ref 0.0–5.0)
CHOLESTEROL TOTAL: 219 mg/dL — AB (ref 100–199)
HDL: 35 mg/dL — AB (ref >39)
LDL CALC: 159 mg/dL — AB (ref 0–99)
TRIGLYCERIDES: 126 mg/dL (ref 0–149)
VLDL CHOLESTEROL CAL: 25 mg/dL (ref 5–40)

## 2015-06-26 LAB — VITAMIN D 25 HYDROXY (VIT D DEFICIENCY, FRACTURES): Vit D, 25-Hydroxy: 29.6 ng/mL — ABNORMAL LOW (ref 30.0–100.0)

## 2015-07-02 ENCOUNTER — Ambulatory Visit (INDEPENDENT_AMBULATORY_CARE_PROVIDER_SITE_OTHER): Payer: BLUE CROSS/BLUE SHIELD | Admitting: Family Medicine

## 2015-07-02 ENCOUNTER — Encounter (INDEPENDENT_AMBULATORY_CARE_PROVIDER_SITE_OTHER): Payer: Self-pay

## 2015-07-02 ENCOUNTER — Encounter: Payer: Self-pay | Admitting: Family Medicine

## 2015-07-02 VITALS — BP 149/93 | HR 59 | Temp 98.4°F | Ht 69.0 in | Wt 201.0 lb

## 2015-07-02 DIAGNOSIS — I10 Essential (primary) hypertension: Secondary | ICD-10-CM | POA: Diagnosis not present

## 2015-07-02 DIAGNOSIS — E785 Hyperlipidemia, unspecified: Secondary | ICD-10-CM | POA: Diagnosis not present

## 2015-07-02 DIAGNOSIS — E559 Vitamin D deficiency, unspecified: Secondary | ICD-10-CM

## 2015-07-02 DIAGNOSIS — N400 Enlarged prostate without lower urinary tract symptoms: Secondary | ICD-10-CM | POA: Diagnosis not present

## 2015-07-02 MED ORDER — LOSARTAN POTASSIUM 25 MG PO TABS: ORAL_TABLET | ORAL | Status: DC

## 2015-07-02 MED ORDER — LOSARTAN POTASSIUM 50 MG PO TABS: 50.0000 mg | ORAL_TABLET | Freq: Every day | ORAL | Status: DC

## 2015-07-02 NOTE — Addendum Note (Signed)
Addended by: Zannie Cove on: 07/02/2015 04:41 PM   Modules accepted: Orders

## 2015-07-02 NOTE — Patient Instructions (Addendum)
Continue current medications. Continue good therapeutic lifestyle changes which include good diet and exercise. Fall precautions discussed with patient. If an FOBT was given today- please return it to our front desk. If you are over 55 years old - you may need Prevnar 47 or the adult Pneumonia vaccine.  **Flu shots will be available soon--- please call and schedule a FLU-CLINIC appointment**  After your visit with [place] today you will receive a survey in the mail or online from [employer] regarding your care with [place]. Please take a moment to fill this out. Your feedback is very important to [place] as you can help [place] better understand your patient needs as well as improve your experience and satisfaction. WE CARE ABOUT YOU!!!   **Please join [place] [email or web address], 2016 from 5:00 to 7:00pm for our OPEN HOUSE! Come out and meet our NEW providers**  Take blood pressure pill low Sartin 25 mg 1-1/2 tablets daily and bring blood pressures by for review in 4 weeks Drink plenty of fluids Keep exercising regularly Watch sodium intake Take vitamin D regularly

## 2015-07-02 NOTE — Progress Notes (Signed)
Subjective:    Patient ID: Brian Estes, male    DOB: [birth year], 54 y.o.   MRN: [id number]  HPI Pt here for follow up and management of chronic medical problems which includes hypertension and hyperlipidemia. He is taking medications regularly.The patient brings in home bps and they are all good. These will be scanned into the record. Recent lab work will be reviewed with him and as usual the biggest problem with this is that he is intolerant to statins and his LDL C is elevated. The patient has been taking his vitamin D 50,000 units but not regularly. He is not taking his low Sartin regularly because he says if he takes 25 once a day he doesn't help his blood pressure and if he takes 2 daily of drops of blood pressure too low. We will discuss with him to change it to 1-1/2 tablets daily of low Sartin 25 mg. The patient denies chest pain shortness of breath trouble swallowing and only has occasional heartburn for which she takes Nexium trouble passing his water or blood in stool.      Patient Active Problem List   Diagnosis Date Noted  . Essential hypertension 06/26/2014  . Statin intolerance 12/11/2013  . BPH (benign prostatic hyperplasia) 12/11/2013  . Vitamin D deficiency 12/11/2013  . 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 B12 deficiency 05/24/2009  . HYPERLIPIDEMIA 05/21/2008  . CROHN'S DISEASE 05/21/2008  . DIVERTICULOSIS, COLON 05/21/2008  . CONSTIPATION 05/21/2008  . FATTY LIVER DISEASE 05/21/2008   Outpatient Encounter Prescriptions as of 07/02/2015  Medication Sig  . finasteride (PROPECIA) 1 MG tablet TAKE 1 TABLET (1 MG TOTAL) BY MOUTH DAILY.  . [DISCONTINUED] esomeprazole (NEXIUM) 40 MG capsule Take 40 mg by mouth daily at 12 noon.  . [DISCONTINUED] fluticasone (FLONASE) 50 MCG/ACT nasal spray Place 2 sprays into both nostrils daily.  . [DISCONTINUED] KRILL OIL PO Take 1 capsule by mouth daily.  . [DISCONTINUED] losartan (COZAAR) 25 MG tablet Take 1 tablet (25 mg total) by mouth  daily. (Patient not taking: Reported on 05/18/2015)   No facility-administered encounter medications on file as of 07/02/2015.      Review of Systems  Constitutional: Negative.   HENT: Negative.   Eyes: Negative.   Respiratory: Negative.   Cardiovascular: Negative.   Gastrointestinal: Negative.   Endocrine: Negative.   Genitourinary: Negative.   Musculoskeletal: Negative.   Skin: Negative.   Allergic/Immunologic: Negative.   Neurological: Negative.   Hematological: Negative.   Psychiatric/Behavioral: Negative.        Objective:   Physical Exam  Constitutional: He is oriented to person, place, and time. He appears well-developed and well-nourished. No distress.  HENT:  Head: Normocephalic and atraumatic.  Right Ear: External ear normal.  Left Ear: External ear normal.  Nose: Nose normal.  Mouth/Throat: Oropharynx is clear and moist. No oropharyngeal exudate.  Eyes: Conjunctivae and EOM are normal. Pupils are equal, round, and reactive to light. Right eye exhibits no discharge. Left eye exhibits no discharge. No scleral icterus.  Neck: Normal range of motion. Neck supple. No thyromegaly present.  No carotid bruits  Cardiovascular: Normal rate, regular rhythm, normal heart sounds and intact distal pulses.   No murmur heard. At 60/m  Pulmonary/Chest: Effort normal and breath sounds normal. No respiratory distress. He has no wheezes. He has no rales. He exhibits no tenderness.  Abdominal: Soft. Bowel sounds are normal. He exhibits no mass. There is no tenderness. There is no rebound and no guarding.  Musculoskeletal: Normal  range of motion. He exhibits no edema or tenderness.  Lymphadenopathy:    He has no cervical adenopathy.  Neurological: He is alert and oriented to person, place, and time. He has normal reflexes. No cranial nerve deficit.  Skin: Skin is warm and dry. No rash noted. No erythema. No pallor.  Psychiatric: He has a normal mood and affect. His behavior is normal.  Judgment and thought content normal.  Nursing note and vitals reviewed.  BP 149/93 mmHg  Pulse 59  Temp(Src) 98.4 F (36.9 C) (Oral)  Ht 5' 9"  (1.753 m)  Wt 201 lb (91.173 kg)  BMI 29.67 kg/m2  Repeat blood pressure 152/92 right arm sitting large cuff      Assessment & Plan:  1. Vitamin D deficiency -Please take this regularly as this is important for your overall health  2. Hyperlipidemia -The patient is statin intolerant and because of commercial insurance does not qualify for one of the new PCS K-9 inhibitors  3. Essential hypertension -Watch sodium intake and take blood pressure medicine more regularly -Bring blood pressures by for review in about 4 weeks after taking 1-1/2 tablets of low Sartin daily  4. BPH (benign prostatic hyperplasia) -Patient is having no problems with this and did not have an exam for this today.  Meds ordered this encounter  Medications  . losartan (COZAAR) 25 MG tablet    Sig: Take 2 tabs daily as directed.    Dispense:  180 tablet    Refill:  3   Patient Instructions  Continue current medications. Continue good therapeutic lifestyle changes which include good diet and exercise. Fall precautions discussed with patient. If an FOBT was given today- please return it to our front desk. If you are over 71 years old - you may need Prevnar 33 or the adult Pneumonia vaccine.  **Flu shots will be available soon--- please call and schedule a FLU-CLINIC appointment**  After your visit with [place] today you will receive a survey in the mail or online from [employer] regarding your care with [place]. Please take a moment to fill this out. Your feedback is very important to [place] as you can help [place] better understand your patient needs as well as improve your experience and satisfaction. WE CARE ABOUT YOU!!!   **Please join [place] [email or web address], 2016 from 5:00 to 7:00pm for our OPEN HOUSE! Come out and meet our NEW providers**  Take blood pressure pill low Sartin 25 mg  1-1/2 tablets daily and bring blood pressures by for review in 4 weeks Drink plenty of fluids Keep exercising regularly Watch sodium intake Take vitamin D regularly   Arrie Senate MD

## 2015-08-20 ENCOUNTER — Ambulatory Visit (INDEPENDENT_AMBULATORY_CARE_PROVIDER_SITE_OTHER): Payer: BLUE CROSS/BLUE SHIELD | Admitting: *Deleted

## 2015-08-20 VITALS — BP 126/79 | HR 74

## 2015-08-20 DIAGNOSIS — I10 Essential (primary) hypertension: Secondary | ICD-10-CM

## 2015-08-20 NOTE — Progress Notes (Signed)
Patient came into the office for a BP recheck. Patients BP 126/79 mmHg  Pulse 74

## 2015-08-20 NOTE — Patient Instructions (Signed)
Hypertension Hypertension, commonly called high blood pressure, is when the force of blood pumping through your arteries is too strong. Your arteries are the blood vessels that carry blood from your heart throughout your body. A blood pressure reading consists of a higher number over a lower number, such as 110/72. The higher number (systolic) is the pressure inside your arteries when your heart pumps. The lower number (diastolic) is the pressure inside your arteries when your heart relaxes. Ideally you want your blood pressure below 120/80. Hypertension forces your heart to work harder to pump blood. Your arteries may become narrow or stiff. Having untreated or uncontrolled hypertension can cause heart attack, stroke, kidney disease, and other problems. RISK FACTORS Some risk factors for high blood pressure are controllable. Others are not.  Risk factors you cannot control include:   Race. You may be at higher risk if you are African American.  Age. Risk increases with age.  Gender. Men are at higher risk than women before age 45 years. After age 65, women are at higher risk than men. Risk factors you can control include:  Not getting enough exercise or physical activity.  Being overweight.  Getting too much fat, sugar, calories, or salt in your diet.  Drinking too much alcohol. SIGNS AND SYMPTOMS Hypertension does not usually cause signs or symptoms. Extremely high blood pressure (hypertensive crisis) may cause headache, anxiety, shortness of breath, and nosebleed. DIAGNOSIS To check if you have hypertension, your health care provider will measure your blood pressure while you are seated, with your arm held at the level of your heart. It should be measured at least twice using the same arm. Certain conditions can cause a difference in blood pressure between your right and left arms. A blood pressure reading that is higher than normal on one occasion does not mean that you need treatment. If  it is not clear whether you have high blood pressure, you may be asked to return on a different day to have your blood pressure checked again. Or, you may be asked to monitor your blood pressure at home for 1 or more weeks. TREATMENT Treating high blood pressure includes making lifestyle changes and possibly taking medicine. Living a healthy lifestyle can help lower high blood pressure. You may need to change some of your habits. Lifestyle changes may include:  Following the DASH diet. This diet is high in fruits, vegetables, and whole grains. It is low in salt, red meat, and added sugars.  Keep your sodium intake below 2,300 mg per day.  Getting at least 30-45 minutes of aerobic exercise at least 4 times per week.  Losing weight if necessary.  Not smoking.  Limiting alcoholic beverages.  Learning ways to reduce stress. Your health care provider may prescribe medicine if lifestyle changes are not enough to get your blood pressure under control, and if one of the following is true:  You are 18-59 years of age and your systolic blood pressure is above 140.  You are 60 years of age or older, and your systolic blood pressure is above 150.  Your diastolic blood pressure is above 90.  You have diabetes, and your systolic blood pressure is over 140 or your diastolic blood pressure is over 90.  You have kidney disease and your blood pressure is above 140/90.  You have heart disease and your blood pressure is above 140/90. Your personal target blood pressure may vary depending on your medical conditions, your age, and other factors. HOME CARE INSTRUCTIONS    Have your blood pressure rechecked as directed by your health care provider.   Take medicines only as directed by your health care provider. Follow the directions carefully. Blood pressure medicines must be taken as prescribed. The medicine does not work as well when you skip doses. Skipping doses also puts you at risk for  problems.  Do not smoke.   Monitor your blood pressure at home as directed by your health care provider. SEEK MEDICAL CARE IF:   You think you are having a reaction to medicines taken.  You have recurrent headaches or feel dizzy.  You have swelling in your ankles.  You have trouble with your vision. SEEK IMMEDIATE MEDICAL CARE IF:  You develop a severe headache or confusion.  You have unusual weakness, numbness, or feel faint.  You have severe chest or abdominal pain.  You vomit repeatedly.  You have trouble breathing. MAKE SURE YOU:   Understand these instructions.  Will watch your condition.  Will get help right away if you are not doing well or get worse.   This information is not intended to replace advice given to you by your health care provider. Make sure you discuss any questions you have with your health care provider.   Document Released: 10/26/2005 Document Revised: 03/12/2015 Document Reviewed: 08/18/2013 Elsevier Interactive Patient Education 2016 Elsevier Inc.  

## 2015-09-16 ENCOUNTER — Ambulatory Visit (INDEPENDENT_AMBULATORY_CARE_PROVIDER_SITE_OTHER): Payer: BLUE CROSS/BLUE SHIELD

## 2015-09-16 DIAGNOSIS — Z23 Encounter for immunization: Secondary | ICD-10-CM

## 2015-12-31 ENCOUNTER — Other Ambulatory Visit: Payer: Self-pay | Admitting: Family Medicine

## 2016-01-01 NOTE — Telephone Encounter (Signed)
Last seen 07/02/15  DWM

## 2016-01-10 ENCOUNTER — Other Ambulatory Visit: Payer: BLUE CROSS/BLUE SHIELD

## 2016-01-10 DIAGNOSIS — E538 Deficiency of other specified B group vitamins: Secondary | ICD-10-CM

## 2016-01-10 DIAGNOSIS — E785 Hyperlipidemia, unspecified: Secondary | ICD-10-CM

## 2016-01-11 LAB — HEPATIC FUNCTION PANEL
ALBUMIN: 4.4 g/dL (ref 3.5–5.5)
ALK PHOS: 98 IU/L (ref 39–117)
ALT: 42 IU/L (ref 0–44)
AST: 29 IU/L (ref 0–40)
BILIRUBIN TOTAL: 0.7 mg/dL (ref 0.0–1.2)
BILIRUBIN, DIRECT: 0.17 mg/dL (ref 0.00–0.40)
Total Protein: 7.7 g/dL (ref 6.0–8.5)

## 2016-01-11 LAB — CBC WITH DIFFERENTIAL/PLATELET
BASOS ABS: 0.1 10*3/uL (ref 0.0–0.2)
Basos: 1 %
EOS (ABSOLUTE): 0.1 10*3/uL (ref 0.0–0.4)
Eos: 2 %
Hematocrit: 44.7 % (ref 37.5–51.0)
Hemoglobin: 15 g/dL (ref 12.6–17.7)
IMMATURE GRANS (ABS): 0 10*3/uL (ref 0.0–0.1)
Immature Granulocytes: 0 %
LYMPHS: 41 %
Lymphocytes Absolute: 2.3 10*3/uL (ref 0.7–3.1)
MCH: 30.2 pg (ref 26.6–33.0)
MCHC: 33.6 g/dL (ref 31.5–35.7)
MCV: 90 fL (ref 79–97)
MONOCYTES: 10 %
Monocytes Absolute: 0.6 10*3/uL (ref 0.1–0.9)
NEUTROS ABS: 2.5 10*3/uL (ref 1.4–7.0)
Neutrophils: 46 %
PLATELETS: 324 10*3/uL (ref 150–379)
RBC: 4.96 x10E6/uL (ref 4.14–5.80)
RDW: 13 % (ref 12.3–15.4)
WBC: 5.6 10*3/uL (ref 3.4–10.8)

## 2016-01-11 LAB — BMP8+EGFR
BUN/Creatinine Ratio: 15 (ref 9–20)
BUN: 14 mg/dL (ref 6–24)
CALCIUM: 9.5 mg/dL (ref 8.7–10.2)
CO2: 24 mmol/L (ref 18–29)
CREATININE: 0.94 mg/dL (ref 0.76–1.27)
Chloride: 98 mmol/L (ref 96–106)
GFR, EST AFRICAN AMERICAN: 105 mL/min/{1.73_m2} (ref >59)
GFR, EST NON AFRICAN AMERICAN: 91 mL/min/{1.73_m2} (ref >59)
Glucose: 87 mg/dL (ref 65–99)
POTASSIUM: 4.5 mmol/L (ref 3.5–5.2)
Sodium: 139 mmol/L (ref 134–144)

## 2016-01-11 LAB — LIPID PANEL
CHOL/HDL RATIO: 6.3 ratio — AB (ref 0.0–5.0)
Cholesterol, Total: 219 mg/dL — ABNORMAL HIGH (ref 100–199)
HDL: 35 mg/dL — AB (ref >39)
LDL Calculated: 159 mg/dL — ABNORMAL HIGH (ref 0–99)
Triglycerides: 126 mg/dL (ref 0–149)
VLDL Cholesterol Cal: 25 mg/dL (ref 5–40)

## 2016-01-11 LAB — VITAMIN D 25 HYDROXY (VIT D DEFICIENCY, FRACTURES): VIT D 25 HYDROXY: 33.2 ng/mL (ref 30.0–100.0)

## 2016-01-16 ENCOUNTER — Encounter: Payer: Self-pay | Admitting: Family Medicine

## 2016-01-16 ENCOUNTER — Ambulatory Visit (INDEPENDENT_AMBULATORY_CARE_PROVIDER_SITE_OTHER): Payer: BLUE CROSS/BLUE SHIELD | Admitting: Family Medicine

## 2016-01-16 ENCOUNTER — Ambulatory Visit (INDEPENDENT_AMBULATORY_CARE_PROVIDER_SITE_OTHER): Payer: BLUE CROSS/BLUE SHIELD

## 2016-01-16 VITALS — BP 125/78 | HR 59 | Temp 97.6°F | Ht 69.0 in | Wt 202.0 lb

## 2016-01-16 DIAGNOSIS — I10 Essential (primary) hypertension: Secondary | ICD-10-CM

## 2016-01-16 DIAGNOSIS — E785 Hyperlipidemia, unspecified: Secondary | ICD-10-CM

## 2016-01-16 DIAGNOSIS — L570 Actinic keratosis: Secondary | ICD-10-CM

## 2016-01-16 DIAGNOSIS — Z789 Other specified health status: Secondary | ICD-10-CM

## 2016-01-16 DIAGNOSIS — E559 Vitamin D deficiency, unspecified: Secondary | ICD-10-CM

## 2016-01-16 DIAGNOSIS — K5010 Crohn's disease of large intestine without complications: Secondary | ICD-10-CM

## 2016-01-16 DIAGNOSIS — Z0000 Encounter for general adult medical examination without abnormal findings: Secondary | ICD-10-CM | POA: Diagnosis not present

## 2016-01-16 DIAGNOSIS — N400 Enlarged prostate without lower urinary tract symptoms: Secondary | ICD-10-CM

## 2016-01-16 DIAGNOSIS — Z8249 Family history of ischemic heart disease and other diseases of the circulatory system: Secondary | ICD-10-CM | POA: Insufficient documentation

## 2016-01-16 LAB — URINALYSIS, COMPLETE
BILIRUBIN UA: NEGATIVE
Glucose, UA: NEGATIVE
Ketones, UA: NEGATIVE
LEUKOCYTES UA: NEGATIVE
Nitrite, UA: NEGATIVE
PH UA: 6.5 (ref 5.0–7.5)
Protein, UA: NEGATIVE
RBC UA: NEGATIVE
Specific Gravity, UA: 1.01 (ref 1.005–1.030)
UUROB: 1 mg/dL (ref 0.2–1.0)

## 2016-01-16 LAB — MICROSCOPIC EXAMINATION
BACTERIA UA: NONE SEEN
EPITHELIAL CELLS (NON RENAL): NONE SEEN /HPF (ref 0–10)
RBC, UA: NONE SEEN /hpf (ref 0–?)
Renal Epithel, UA: NONE SEEN /hpf
WBC, UA: NONE SEEN /hpf (ref 0–?)

## 2016-01-16 NOTE — Progress Notes (Signed)
Subjective:    Patient ID: Brian Estes, male    DOB: 03-09-1961, 55 y.o.   MRN: [id number]  HPI Patient is here today for annual wellness exam and follow up of chronic medical problems which includes hypertension and hyperlipidemia. He is taking medications regularly. The patient is doing well with no specific complaints. He denies chest pain shortness of breath trouble swallowing heartburn indigestion only occasionally nausea vomiting diarrhea blood in the stool or black tarry bowel movements. He has colitis and his last colonoscopy was in 2012 and they told him he did not need another one until 10 years later. The Crohn's disease or colitis seems to be stable and he says that watching his diet has helped with this considerably. He is passing his water without problems and no burning pain or frequency and his sex life is normal. He was reminded that he needs to get a chest x-ray rate today and to return an FOBT. He will also get a urinalysis today. His weight is stable only up 1 pound from the last visit. He indicates he has not had an eye exam in a good while.    Patient Active Problem List   Diagnosis Date Noted  . Essential hypertension 06/26/2014  . Statin intolerance 12/11/2013  . BPH (benign prostatic hyperplasia) 12/11/2013  . Vitamin D deficiency 12/11/2013  . 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 B12 deficiency 05/24/2009  . HYPERLIPIDEMIA 05/21/2008  . CROHN'S DISEASE 05/21/2008  . DIVERTICULOSIS, COLON 05/21/2008  . CONSTIPATION 05/21/2008  . FATTY LIVER DISEASE 05/21/2008   Outpatient Encounter Prescriptions as of 01/16/2016  Medication Sig  . finasteride (PROPECIA) 1 MG tablet TAKE 1 TABLET (1 MG TOTAL) BY MOUTH DAILY.  Marland Kitchen losartan (COZAAR) 50 MG tablet Take 1 tablet (50 mg total) by mouth daily.   No facility-administered encounter medications on file as of 01/16/2016.      Review of Systems  Constitutional: Negative.   HENT: Negative.   Eyes: Negative.   Respiratory:  Negative.   Cardiovascular: Negative.   Gastrointestinal: Negative.   Endocrine: Negative.   Genitourinary: Negative.   Musculoskeletal: Negative.   Skin: Negative.   Allergic/Immunologic: Negative.   Neurological: Negative.   Hematological: Negative.   Psychiatric/Behavioral: Negative.        Objective:   Physical Exam  Constitutional: He is oriented to person, place, and time. He appears well-developed and well-nourished. No distress.  HENT:  Head: Normocephalic and atraumatic.  Right Ear: External ear normal.  Left Ear: External ear normal.  Nose: Nose normal.  Mouth/Throat: Oropharynx is clear and moist. No oropharyngeal exudate.  Eyes: Conjunctivae and EOM are normal. Pupils are equal, round, and reactive to light. Right eye exhibits no discharge. Left eye exhibits no discharge. No scleral icterus.  Neck: Normal range of motion. Neck supple. No thyromegaly present.  No bruits or thyromegaly or adenopathy  Cardiovascular: Normal rate, regular rhythm, normal heart sounds and intact distal pulses.   No murmur heard. The heart is regular at 60/m  Pulmonary/Chest: Effort normal and breath sounds normal. No respiratory distress. He has no wheezes. He has no rales. He exhibits no tenderness.  Clear anteriorly and posteriorly without axillary adenopathy  Abdominal: Soft. Bowel sounds are normal. He exhibits no mass. There is no tenderness. There is no rebound and no guarding.  No abdominal tenderness liver or spleen enlargement or inguinal adenopathy. No bruits.  Genitourinary: Rectum normal and penis normal.  The prostate is slightly enlarged but soft and smooth.  There are no rectal masses. The external genitalia were normal and no inguinal hernias were palpable.  Musculoskeletal: Normal range of motion. He exhibits no edema.  Lymphadenopathy:    He has no cervical adenopathy.  Neurological: He is alert and oriented to person, place, and time. He has normal reflexes. No cranial  nerve deficit.  Skin: Skin is warm and dry. No rash noted.  He does have a couple of actinic keratoses one on his left scapula and one on his right hand which we will do cryotherapy on. He tolerated this without problems.  Psychiatric: He has a normal mood and affect. His behavior is normal. Judgment and thought content normal.  Nursing note and vitals reviewed.  BP 125/78 mmHg  Pulse 59  Temp(Src) 97.6 F (36.4 C) (Oral)  Ht 5' 9"  (1.753 m)  Wt 202 lb (91.627 kg)  BMI 29.82 kg/m2  [facility] reading (PRIMARY) by  Dr. Brunilda Payor x-ray-   results pending                                     Assessment & Plan:  1. Essential hypertension -Continue current treatment and sodium restriction - DG Chest 2 View; Future  2. Annual physical exam -Return the FOBT. -Be sure and check with insurance regarding the shingles vaccine and the Prevnar vaccine - Urinalysis, Complete - DG Chest 2 View; Future  3. Hyperlipidemia -Continue with aggressive therapeutic lifestyle changes which include diet and exercise  4. Vitamin D deficiency -The patient does not tolerate the once weekly vitamin D and he will try vitamin D3 2000 units 1 daily.   5. BPH (benign prostatic hyperplasia) -He is having no symptoms with the mildly enlarged prostate gland. The PSAs being done today and will be added to his recent blood work. - Urinalysis, Complete  6. Statin intolerance -He understands the importance of getting his cholesterol down and because he is statin intolerant he will continue with his exercise regimen and walking.  7. 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 Family history of heart disease -The patient understands the importance of exercising and controlling cholesterol and will continue to work with his walking  9. Actinic keratosis -Cryotherapy to hand lesion and left scapular region   No orders of the  defined types were placed in this encounter.   Patient Instructions                       Medicare Annual Wellness Visit  [facility] and the medical providers at [facility] strive to bring you the best medical care.  In doing so we not only want to address your current medical conditions and concerns but also to detect new conditions early and prevent illness, disease and health-related problems.    Medicare offers a yearly Wellness Visit which allows our clinical staff to assess your need for preventative services including immunizations, lifestyle education, counseling to decrease risk of preventable diseases and screening for fall risk and other medical concerns.    This visit is provided free of charge (no copay) for all Medicare recipients. The clinical pharmacists at [facility] have begun to conduct these Wellness Visits which will also include a thorough review of all your medications.    As you primary medical provider recommend that you make an appointment for your Annual Wellness  Visit if you have not done so already this year.  You may set up this appointment before you leave today or you may call back ([phone]) and schedule an appointment.  Please make sure when you call that you mention that you are scheduling your Annual Wellness Visit with the clinical pharmacist so that the appointment may be made for the proper length of time.     Continue current medications. Continue good therapeutic lifestyle changes which include good diet and exercise. Fall precautions discussed with patient. If an FOBT was given today- please return it to our front desk. If you are over 58 years old - you may need Prevnar 70 or the adult Pneumonia vaccine.  **Flu shots are available--- please call and schedule a FLU-CLINIC appointment**  After your visit with [place] today you will receive a survey in the mail or online from [employer] regarding your care with  [place]. Please take a moment to fill this out. Your feedback is very important to [place] as you can help [place] better understand your patient needs as well as improve your experience and satisfaction. WE CARE ABOUT YOU!!!   Please check with the insurance regarding the Prevnar vaccine and the shingles shot Please return the FOBT We will call with results of the PSA test and the FOBT is sent as those results become available We will also call with the results of the chest x-ray when that becomes available Do not forget to get a good eye exam Try taking vitamin D3, 2000 units, 1 daily. I prefer the now brand which can be purchased at [facility] or the drugstore in [place] with walking and exercise and watching the diet as closely as possible because of your family history of heart disease   Arrie Senate MD

## 2016-01-16 NOTE — Patient Instructions (Addendum)
Medicare Annual Wellness Visit  [facility] and the medical providers at [facility] strive to bring you the best medical care.  In doing so we not only want to address your current medical conditions and concerns but also to detect new conditions early and prevent illness, disease and health-related problems.    Medicare offers a yearly Wellness Visit which allows our clinical staff to assess your need for preventative services including immunizations, lifestyle education, counseling to decrease risk of preventable diseases and screening for fall risk and other medical concerns.    This visit is provided free of charge (no copay) for all Medicare recipients. The clinical pharmacists at [facility] have begun to conduct these Wellness Visits which will also include a thorough review of all your medications.    As you primary medical provider recommend that you make an appointment for your Annual Wellness Visit if you have not done so already this year.  You may set up this appointment before you leave today or you may call back ([phone]) and schedule an appointment.  Please make sure when you call that you mention that you are scheduling your Annual Wellness Visit with the clinical pharmacist so that the appointment may be made for the proper length of time.     Continue current medications. Continue good therapeutic lifestyle changes which include good diet and exercise. Fall precautions discussed with patient. If an FOBT was given today- please return it to our front desk. If you are over 56 years old - you may need Prevnar 16 or the adult Pneumonia vaccine.  **Flu shots are available--- please call and schedule a FLU-CLINIC appointment**  After your visit with [place] today you will receive a survey in the mail or online from [employer] regarding your care with [place]. Please take a moment to fill this out. Your feedback is very  important to [place] as you can help [place] better understand your patient needs as well as improve your experience and satisfaction. WE CARE ABOUT YOU!!!   Please check with the insurance regarding the Prevnar vaccine and the shingles shot Please return the FOBT We will call with results of the PSA test and the FOBT is sent as those results become available We will also call with the results of the chest x-ray when that becomes available Do not forget to get a good eye exam Try taking vitamin D3, 2000 units, 1 daily. I prefer the now brand which can be purchased at [facility] or the drugstore in [place] with walking and exercise and watching the diet as closely as possible because of your family history of heart disease

## 2016-01-17 LAB — PSA: PROSTATE SPECIFIC AG, SERUM: 0.4 ng/mL (ref 0.0–4.0)

## 2016-01-17 LAB — SPECIMEN STATUS REPORT

## 2016-03-28 ENCOUNTER — Other Ambulatory Visit: Payer: Self-pay | Admitting: Family Medicine

## 2016-06-16 ENCOUNTER — Telehealth: Payer: Self-pay | Admitting: Nurse Practitioner

## 2016-06-16 ENCOUNTER — Ambulatory Visit (INDEPENDENT_AMBULATORY_CARE_PROVIDER_SITE_OTHER): Payer: BLUE CROSS/BLUE SHIELD | Admitting: Nurse Practitioner

## 2016-06-16 ENCOUNTER — Encounter: Payer: Self-pay | Admitting: Nurse Practitioner

## 2016-06-16 VITALS — BP 135/80 | HR 57 | Temp 97.8°F | Ht 69.0 in | Wt 206.0 lb

## 2016-06-16 DIAGNOSIS — L255 Unspecified contact dermatitis due to plants, except food: Secondary | ICD-10-CM

## 2016-06-16 MED ORDER — METHYLPREDNISOLONE ACETATE 80 MG/ML IJ SUSP
80.0000 mg | Freq: Once | INTRAMUSCULAR | Status: AC
  Administered 2016-06-16: 80 mg via INTRAMUSCULAR

## 2016-06-16 MED ORDER — PREDNISONE 20 MG PO TABS: ORAL_TABLET | ORAL | 0 refills | Status: DC

## 2016-06-16 NOTE — Patient Instructions (Signed)
Contact Dermatitis Dermatitis is redness, soreness, and swelling (inflammation) of the skin. Contact dermatitis is a reaction to certain substances that touch the skin. You either touched something that irritated your skin, or you have allergies to something you touched.  HOME CARE  Skin Care  Moisturize your skin as needed.  Apply cool compresses to the affected areas.   Try taking a bath with:   Epsom salts. Follow the instructions on the package. You can get these at a pharmacy or grocery store.   Baking soda. Pour a small amount into the bath as told by your doctor.   Colloidal oatmeal. Follow the instructions on the package. You can get this at a pharmacy or grocery store.   Try applying baking soda paste to your skin. Stir water into baking soda until it looks like paste.  Do not scratch your skin.   Bathe less often.  Bathe in lukewarm water. Avoid using hot water.  Medicines  Take or apply over-the-counter and prescription medicines only as told by your doctor.   If you were prescribed an antibiotic medicine, take or apply your antibiotic as told by your doctor. Do not stop taking the antibiotic even if your condition starts to get better. General Instructions  Keep all follow-up visits as told by your doctor. This is important.   Avoid the substance that caused your reaction. If you do not know what caused it, keep a journal to try to track what caused it. Write down:   What you eat.   What cosmetic products you use.   What you drink.   What you wear in the affected area. This includes jewelry.   If you were given a bandage (dressing), take care of it as told by your doctor. This includes when to change and remove it.  GET HELP IF:   You do not get better with treatment.   Your condition gets worse.   You have signs of infection such as:  Swelling.  Tenderness.  Redness.  Soreness.  Warmth.   You have a fever.   You have new  symptoms.  GET HELP RIGHT AWAY IF:   You have a very bad headache.  You have neck pain.  Your neck is stiff.   You throw up (vomit).   You feel very sleepy.   You see red streaks coming from the affected area.   Your bone or joint underneath the affected area becomes painful after the skin has healed.   The affected area turns darker.   You have trouble breathing.    This information is not intended to replace advice given to you by your health care provider. Make sure you discuss any questions you have with your health care provider.   Document Released: 08/23/2009 Document Revised: 07/17/2015 Document Reviewed: 03/13/2015 Elsevier Interactive Patient Education [employer].

## 2016-06-16 NOTE — Progress Notes (Signed)
   Subjective:    Patient ID: Brian Estes, male    DOB: [birth year], 55 y.o.   MRN: [id number]  HPI Patient in today c/o rash that developed after pulling up some weeds in the yard. Rash is mainly on private area- said that he went to bathroom without washing his hands while he was in the middle of pulling weeds and the rash is mainly on his penis. Very itchy.    Review of Systems  Constitutional: Negative.   HENT: Negative.   Respiratory: Negative.   Cardiovascular: Negative.   Genitourinary: Negative.   Neurological: Negative.   Psychiatric/Behavioral: Negative.   All other systems reviewed and are negative.      Objective:   Physical Exam  Constitutional: He appears well-developed and well-nourished.  Cardiovascular: Normal rate, regular rhythm and normal heart sounds.   Pulmonary/Chest: Effort normal and breath sounds normal.  Skin: Skin is warm. Rash (erythematous maculopapular rash in linear patternson shat of penis.) noted.   BP 135/80   Pulse (!) 57   Temp 97.8 F (36.6 C) (Oral)   Ht 5' 9"  (1.753 m)   Wt 206 lb (93.4 kg)   BMI 30.42 kg/m         Assessment & Plan:  1. Contact dermatitis due to plant Avoid scratching Cool compresses RTOprn - methylPREDNISolone acetate (DEPO-MEDROL) injection 80 mg; Inject 1 mL (80 mg total) into the muscle once.  Mary-Margaret Hassell Done, FNP

## 2016-06-16 NOTE — Telephone Encounter (Signed)
rx sent to pharmacy

## 2016-06-25 ENCOUNTER — Other Ambulatory Visit: Payer: Self-pay | Admitting: Family Medicine

## 2016-07-24 ENCOUNTER — Other Ambulatory Visit: Payer: Self-pay | Admitting: Family Medicine

## 2016-07-28 ENCOUNTER — Other Ambulatory Visit: Payer: BLUE CROSS/BLUE SHIELD

## 2016-07-28 DIAGNOSIS — I10 Essential (primary) hypertension: Secondary | ICD-10-CM

## 2016-07-28 DIAGNOSIS — E785 Hyperlipidemia, unspecified: Secondary | ICD-10-CM

## 2016-07-28 DIAGNOSIS — N400 Enlarged prostate without lower urinary tract symptoms: Secondary | ICD-10-CM

## 2016-07-28 DIAGNOSIS — Z8249 Family history of ischemic heart disease and other diseases of the circulatory system: Secondary | ICD-10-CM

## 2016-07-28 DIAGNOSIS — E559 Vitamin D deficiency, unspecified: Secondary | ICD-10-CM

## 2016-07-29 LAB — CBC WITH DIFFERENTIAL/PLATELET
BASOS: 1 %
Basophils Absolute: 0.1 10*3/uL (ref 0.0–0.2)
EOS (ABSOLUTE): 0.1 10*3/uL (ref 0.0–0.4)
EOS: 2 %
HEMATOCRIT: 42.8 % (ref 37.5–51.0)
HEMOGLOBIN: 14.6 g/dL (ref 12.6–17.7)
IMMATURE GRANS (ABS): 0 10*3/uL (ref 0.0–0.1)
IMMATURE GRANULOCYTES: 0 %
LYMPHS: 27 %
Lymphocytes Absolute: 1.9 10*3/uL (ref 0.7–3.1)
MCH: 30.8 pg (ref 26.6–33.0)
MCHC: 34.1 g/dL (ref 31.5–35.7)
MCV: 90 fL (ref 79–97)
Monocytes Absolute: 0.7 10*3/uL (ref 0.1–0.9)
Monocytes: 11 %
NEUTROS ABS: 4.2 10*3/uL (ref 1.4–7.0)
NEUTROS PCT: 59 %
PLATELETS: 308 10*3/uL (ref 150–379)
RBC: 4.74 x10E6/uL (ref 4.14–5.80)
RDW: 13.1 % (ref 12.3–15.4)
WBC: 7 10*3/uL (ref 3.4–10.8)

## 2016-07-29 LAB — HEPATIC FUNCTION PANEL
ALK PHOS: 90 IU/L (ref 39–117)
ALT: 36 IU/L (ref 0–44)
AST: 24 IU/L (ref 0–40)
Albumin: 4.2 g/dL (ref 3.5–5.5)
BILIRUBIN TOTAL: 0.8 mg/dL (ref 0.0–1.2)
BILIRUBIN, DIRECT: 0.16 mg/dL (ref 0.00–0.40)
Total Protein: 7.4 g/dL (ref 6.0–8.5)

## 2016-07-29 LAB — BMP8+EGFR
BUN / CREAT RATIO: 17 (ref 9–20)
BUN: 14 mg/dL (ref 6–24)
CALCIUM: 9.2 mg/dL (ref 8.7–10.2)
CHLORIDE: 101 mmol/L (ref 96–106)
CO2: 24 mmol/L (ref 18–29)
CREATININE: 0.84 mg/dL (ref 0.76–1.27)
GFR, EST AFRICAN AMERICAN: 113 mL/min/{1.73_m2} (ref >59)
GFR, EST NON AFRICAN AMERICAN: 98 mL/min/{1.73_m2} (ref >59)
Glucose: 84 mg/dL (ref 65–99)
Potassium: 4.2 mmol/L (ref 3.5–5.2)
Sodium: 141 mmol/L (ref 134–144)

## 2016-07-29 LAB — NMR, LIPOPROFILE
Cholesterol: 214 mg/dL — ABNORMAL HIGH (ref 100–199)
HDL Cholesterol by NMR: 39 mg/dL — ABNORMAL LOW (ref >39)
HDL PARTICLE NUMBER: 24.8 umol/L — AB (ref >=30.5)
LDL Particle Number: 1822 nmol/L — ABNORMAL HIGH (ref <1000)
LDL Size: 20.6 nm (ref >20.5)
LDL-C: 155 mg/dL — ABNORMAL HIGH (ref 0–99)
LP-IR SCORE: 44 (ref <=45)
SMALL LDL PARTICLE NUMBER: 881 nmol/L — AB (ref <=527)
Triglycerides by NMR: 99 mg/dL (ref 0–149)

## 2016-07-29 LAB — VITAMIN D 25 HYDROXY (VIT D DEFICIENCY, FRACTURES): VIT D 25 HYDROXY: 22.9 ng/mL — AB (ref 30.0–100.0)

## 2016-07-31 LAB — VITAMIN B12: VITAMIN B 12: 184 pg/mL — AB (ref 211–946)

## 2016-07-31 LAB — SPECIMEN STATUS REPORT

## 2016-08-03 ENCOUNTER — Ambulatory Visit (INDEPENDENT_AMBULATORY_CARE_PROVIDER_SITE_OTHER): Payer: BLUE CROSS/BLUE SHIELD | Admitting: Family Medicine

## 2016-08-03 ENCOUNTER — Encounter: Payer: Self-pay | Admitting: Family Medicine

## 2016-08-03 VITALS — BP 131/82 | HR 59 | Temp 98.0°F | Ht 69.0 in | Wt 202.0 lb

## 2016-08-03 DIAGNOSIS — Z23 Encounter for immunization: Secondary | ICD-10-CM | POA: Diagnosis not present

## 2016-08-03 DIAGNOSIS — N400 Enlarged prostate without lower urinary tract symptoms: Secondary | ICD-10-CM

## 2016-08-03 DIAGNOSIS — E559 Vitamin D deficiency, unspecified: Secondary | ICD-10-CM

## 2016-08-03 DIAGNOSIS — E785 Hyperlipidemia, unspecified: Secondary | ICD-10-CM

## 2016-08-03 DIAGNOSIS — E538 Deficiency of other specified B group vitamins: Secondary | ICD-10-CM | POA: Diagnosis not present

## 2016-08-03 DIAGNOSIS — K5010 Crohn's disease of large intestine without complications: Secondary | ICD-10-CM | POA: Diagnosis not present

## 2016-08-03 DIAGNOSIS — Z889 Allergy status to unspecified drugs, medicaments and biological substances status: Secondary | ICD-10-CM | POA: Diagnosis not present

## 2016-08-03 DIAGNOSIS — I10 Essential (primary) hypertension: Secondary | ICD-10-CM | POA: Diagnosis not present

## 2016-08-03 DIAGNOSIS — Z789 Other specified health status: Secondary | ICD-10-CM

## 2016-08-03 MED ORDER — EPINEPHRINE 0.3 MG/0.3ML IJ SOAJ
0.3000 mg | Freq: Once | INTRAMUSCULAR | 3 refills | Status: DC
Start: 2016-08-03 — End: 2017-10-13

## 2016-08-03 NOTE — Patient Instructions (Addendum)
Continue current medications. Continue good therapeutic lifestyle changes which include good diet and exercise. Fall precautions discussed with patient. If an FOBT was given today- please return it to our front desk. If you are over 56 years old - you may need Prevnar 31 or the adult Pneumonia vaccine.  **Flu shots are available--- please call and schedule a FLU-CLINIC appointment**  After your visit with [place] today you will receive a survey in the mail or online from [employer] regarding your care with [place]. Please take a moment to fill this out. Your feedback is very important to [place] as you can help [place] better understand your patient needs as well as improve your experience and satisfaction. WE CARE ABOUT YOU!!!   The flu shot today you'll receive today may make your arm sore Reconsider trying the B12 injections Continue to exercise regularly and watch her diet as closely as possible Continue to follow-up with gastroenterology for colitis

## 2016-08-03 NOTE — Progress Notes (Signed)
Subjective:    Patient ID: Brian Estes, male    DOB: [birth year], 55 y.o.   MRN: [id number]  HPI Pt here for follow up and management of chronic medical problems which includes hyperlipidemia and hypertension. He is taking medications regularly.Patient is doing well overall. He has been notified about his lab work and we will review it with him in the office today. He is statin intolerant. He also has problems taking vitamin D. He is known that he is B12 deficient and has taken shots in the past was decided he does not want to take any more injections of B12 and this was years ago. He remains B12 deficient and has diminished vitamin D levels. He has hyperlipidemia and all the statins have been tried. He continues to walk and exercise regularly. He will get his flu shot today and we will review all his lab work with him. He is due to receive an FOBT. The patient denies any chest pain or shortness of breath. He's doing the best with his stomach that he has done and a good while and says he's not do his colonoscopy until 2 more years. He denies any nausea vomiting heartburn black tarry bowel movements or change in bowel habits beyond what he is had in the past. He is always had some blood in the stool. He is passing his water without problems. He says it taking vitamin D makes him dizzy and he does not want to take B12 is Re: Mention. He denies any headaches blurred vision or dizziness.       Patient Active Problem List   Diagnosis Date Noted  . Family history of heart disease 01/16/2016  . Essential hypertension 06/26/2014  . Statin intolerance 12/11/2013  . BPH (benign prostatic hyperplasia) 12/11/2013  . Vitamin D deficiency 12/11/2013  . 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 B12 deficiency 05/24/2009  . Hyperlipidemia 05/21/2008  . CROHN'S DISEASE 05/21/2008  . DIVERTICULOSIS, COLON 05/21/2008  . CONSTIPATION 05/21/2008  . FATTY LIVER DISEASE 05/21/2008   Outpatient Encounter  Prescriptions as of 08/03/2016  Medication Sig  . finasteride (PROPECIA) 1 MG tablet TAKE 1 TABLET (1 MG TOTAL) BY MOUTH DAILY.  Marland Kitchen losartan (COZAAR) 50 MG tablet TAKE 1 TABLET (50 MG TOTAL) BY MOUTH DAILY.  . [DISCONTINUED] predniSONE (DELTASONE) 20 MG tablet 2 po at sametime daily for 5 days- start tomorrow   No facility-administered encounter medications on file as of 08/03/2016.      Review of Systems  Constitutional: Negative.   HENT: Negative.   Eyes: Negative.   Respiratory: Negative.   Cardiovascular: Negative.   Gastrointestinal: Negative.   Endocrine: Negative.   Genitourinary: Negative.   Musculoskeletal: Negative.   Skin: Negative.   Allergic/Immunologic: Negative.   Neurological: Negative.   Hematological: Negative.   Psychiatric/Behavioral: Negative.        Objective:   Physical Exam  Constitutional: He is oriented to person, place, and time. He appears well-developed and well-nourished. No distress.  HENT:  Head: Normocephalic and atraumatic.  Right Ear: External ear normal.  Left Ear: External ear normal.  Nose: Nose normal.  Mouth/Throat: Oropharynx is clear and moist. No oropharyngeal exudate.  Eyes: Conjunctivae and EOM are normal. Pupils are equal, round, and reactive to light. Right eye exhibits no discharge. Left eye exhibits no discharge. No scleral icterus.  Neck: Normal range of motion. Neck supple. No thyromegaly present.  No bruits thyromegaly or anterior cervical adenopathy  Cardiovascular: Normal rate, regular rhythm, normal heart  sounds and intact distal pulses.   No murmur heard. Heart is regular at 60/m  Pulmonary/Chest: Effort normal and breath sounds normal. No respiratory distress. He has no wheezes. He has no rales. He exhibits no tenderness.  Clear anteriorly and posteriorly and no axillary adenopathy  Abdominal: Soft. Bowel sounds are normal. He exhibits no mass. There is no tenderness. There is no rebound and no guarding.  No abdominal  tenderness bruits organ enlargement or masses or inguinal adenopathy  Musculoskeletal: Normal range of motion. He exhibits no edema.  Lymphadenopathy:    He has no cervical adenopathy.  Neurological: He is alert and oriented to person, place, and time. He has normal reflexes. No cranial nerve deficit.  Skin: Skin is warm and dry. No rash noted.  Psychiatric: He has a normal mood and affect. His behavior is normal. Judgment and thought content normal.  Nursing note and vitals reviewed.  BP 131/82 (BP Location: Left Arm)   Pulse (!) 59   Temp 98 F (36.7 C) (Oral)   Ht 5' 9"  (1.753 m)   Wt 202 lb (91.6 kg)   BMI 29.83 kg/m         Assessment & Plan:  1. Essential hypertension -The blood pressure is good today and he will continue with current treatment  2. Hyperlipidemia -Cholesterol numbers have actually improved a still elevated he will continue with his aggressive therapeutic lifestyle changes  3. Vitamin D deficiency -The patient refuses to do vitamin D as this makes him dizzy he thinks.  4. BPH (benign prostatic hyperplasia) -No symptoms with voiding  5. Statin intolerance -He is tried multiple statins and has been intolerant to all of them.  6. B12 deficiency -He refuses to do any B12 shots and says that he try them wants and that he cannot see any improvement. I ask him to go to read up on(B-12 deficiency and if he changes his mind about getting injections to let us know.  7. 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 According to the patient is neck colonoscopy is due in a couple of years. He does need to return an FOBT which she will be given today.  Patient Instructions  Continue current medications. Continue good therapeutic lifestyle changes which include good diet and exercise. Fall precautions discussed with patient. If an FOBT was given today- please return it to our front desk. If you are over 96 years  old - you may need Prevnar 11 or the adult Pneumonia vaccine.  **Flu shots are available--- please call and schedule a FLU-CLINIC appointment**  After your visit with [place] today you will receive a survey in the mail or online from [employer] regarding your care with [place]. Please take a moment to fill this out. Your feedback is very important to [place] as you can help [place] better understand your patient needs as well as improve your experience and satisfaction. WE CARE ABOUT YOU!!!   The flu shot today you'll receive today may make your arm sore Reconsider trying the B12 injections Continue to exercise regularly and watch her diet as closely as possible Continue to follow-up with gastroenterology for colitis    Arrie Senate MD

## 2016-12-17 ENCOUNTER — Other Ambulatory Visit: Payer: Self-pay | Admitting: Family Medicine

## 2017-02-09 ENCOUNTER — Other Ambulatory Visit: Payer: BC Managed Care – PPO

## 2017-02-09 DIAGNOSIS — N400 Enlarged prostate without lower urinary tract symptoms: Secondary | ICD-10-CM

## 2017-02-09 DIAGNOSIS — I10 Essential (primary) hypertension: Secondary | ICD-10-CM

## 2017-02-09 DIAGNOSIS — Z0000 Encounter for general adult medical examination without abnormal findings: Secondary | ICD-10-CM

## 2017-02-09 DIAGNOSIS — E559 Vitamin D deficiency, unspecified: Secondary | ICD-10-CM

## 2017-02-09 DIAGNOSIS — E7800 Pure hypercholesterolemia, unspecified: Secondary | ICD-10-CM

## 2017-02-10 LAB — CBC WITH DIFFERENTIAL/PLATELET
Basophils Absolute: 0 10*3/uL (ref 0.0–0.2)
Basos: 1 %
EOS (ABSOLUTE): 0.2 10*3/uL (ref 0.0–0.4)
Eos: 2 %
Hematocrit: 42.8 % (ref 37.5–51.0)
Hemoglobin: 14.4 g/dL (ref 13.0–17.7)
IMMATURE GRANS (ABS): 0 10*3/uL (ref 0.0–0.1)
IMMATURE GRANULOCYTES: 0 %
Lymphocytes Absolute: 2.3 10*3/uL (ref 0.7–3.1)
Lymphs: 31 %
MCH: 30.3 pg (ref 26.6–33.0)
MCHC: 33.6 g/dL (ref 31.5–35.7)
MCV: 90 fL (ref 79–97)
MONOS ABS: 1 10*3/uL — AB (ref 0.1–0.9)
Monocytes: 13 %
NEUTROS PCT: 53 %
Neutrophils Absolute: 4 10*3/uL (ref 1.4–7.0)
PLATELETS: 343 10*3/uL (ref 150–379)
RBC: 4.75 x10E6/uL (ref 4.14–5.80)
RDW: 12.7 % (ref 12.3–15.4)
WBC: 7.5 10*3/uL (ref 3.4–10.8)

## 2017-02-10 LAB — NMR, LIPOPROFILE
CHOLESTEROL: 193 mg/dL (ref 100–199)
HDL Cholesterol by NMR: 33 mg/dL — ABNORMAL LOW (ref >39)
HDL Particle Number: 22.5 umol/L — ABNORMAL LOW (ref >=30.5)
LDL Particle Number: 1492 nmol/L — ABNORMAL HIGH (ref <1000)
LDL SIZE: 20.7 nm (ref >20.5)
LDL-C: 142 mg/dL — AB (ref 0–99)
LP-IR SCORE: 44 (ref <=45)
SMALL LDL PARTICLE NUMBER: 540 nmol/L — AB (ref <=527)
Triglycerides by NMR: 92 mg/dL (ref 0–149)

## 2017-02-10 LAB — THYROID PANEL WITH TSH
FREE THYROXINE INDEX: 1.7 (ref 1.2–4.9)
T3 UPTAKE RATIO: 24 % (ref 24–39)
T4, Total: 7.2 ug/dL (ref 4.5–12.0)
TSH: 2.63 u[IU]/mL (ref 0.450–4.500)

## 2017-02-10 LAB — HEPATIC FUNCTION PANEL
ALBUMIN: 4.1 g/dL (ref 3.5–5.5)
ALT: 30 IU/L (ref 0–44)
AST: 26 IU/L (ref 0–40)
Alkaline Phosphatase: 86 IU/L (ref 39–117)
BILIRUBIN TOTAL: 0.9 mg/dL (ref 0.0–1.2)
Bilirubin, Direct: 0.19 mg/dL (ref 0.00–0.40)
Total Protein: 7.3 g/dL (ref 6.0–8.5)

## 2017-02-10 LAB — BMP8+EGFR
BUN/Creatinine Ratio: 15 (ref 9–20)
BUN: 14 mg/dL (ref 6–24)
CALCIUM: 9.1 mg/dL (ref 8.7–10.2)
CHLORIDE: 100 mmol/L (ref 96–106)
CO2: 22 mmol/L (ref 18–29)
Creatinine, Ser: 0.92 mg/dL (ref 0.76–1.27)
GFR calc Af Amer: 107 mL/min/{1.73_m2} (ref >59)
GFR, EST NON AFRICAN AMERICAN: 93 mL/min/{1.73_m2} (ref >59)
Glucose: 89 mg/dL (ref 65–99)
POTASSIUM: 4.4 mmol/L (ref 3.5–5.2)
SODIUM: 139 mmol/L (ref 134–144)

## 2017-02-10 LAB — PSA, TOTAL AND FREE
PSA, Free Pct: 22.5 %
PSA, Free: 0.09 ng/mL
Prostate Specific Ag, Serum: 0.4 ng/mL (ref 0.0–4.0)

## 2017-02-10 LAB — VITAMIN D 25 HYDROXY (VIT D DEFICIENCY, FRACTURES): Vit D, 25-Hydroxy: 19 ng/mL — ABNORMAL LOW (ref 30.0–100.0)

## 2017-02-15 ENCOUNTER — Ambulatory Visit (INDEPENDENT_AMBULATORY_CARE_PROVIDER_SITE_OTHER): Payer: BC Managed Care – PPO | Admitting: Family Medicine

## 2017-02-15 ENCOUNTER — Encounter: Payer: Self-pay | Admitting: Family Medicine

## 2017-02-15 VITALS — BP 122/77 | HR 64 | Temp 98.5°F | Ht 69.0 in | Wt 204.0 lb

## 2017-02-15 DIAGNOSIS — Z23 Encounter for immunization: Secondary | ICD-10-CM

## 2017-02-15 DIAGNOSIS — K5010 Crohn's disease of large intestine without complications: Secondary | ICD-10-CM

## 2017-02-15 DIAGNOSIS — N400 Enlarged prostate without lower urinary tract symptoms: Secondary | ICD-10-CM

## 2017-02-15 DIAGNOSIS — I10 Essential (primary) hypertension: Secondary | ICD-10-CM

## 2017-02-15 DIAGNOSIS — Z0000 Encounter for general adult medical examination without abnormal findings: Secondary | ICD-10-CM

## 2017-02-15 DIAGNOSIS — E7800 Pure hypercholesterolemia, unspecified: Secondary | ICD-10-CM

## 2017-02-15 DIAGNOSIS — Z789 Other specified health status: Secondary | ICD-10-CM

## 2017-02-15 DIAGNOSIS — E559 Vitamin D deficiency, unspecified: Secondary | ICD-10-CM

## 2017-02-15 LAB — MICROSCOPIC EXAMINATION
Bacteria, UA: NONE SEEN
EPITHELIAL CELLS (NON RENAL): NONE SEEN /HPF (ref 0–10)
RBC MICROSCOPIC, UA: NONE SEEN /HPF (ref 0–?)
RENAL EPITHEL UA: NONE SEEN /HPF
WBC UA: NONE SEEN /HPF (ref 0–?)

## 2017-02-15 LAB — URINALYSIS, COMPLETE
BILIRUBIN UA: NEGATIVE
Glucose, UA: NEGATIVE
Ketones, UA: NEGATIVE
Leukocytes, UA: NEGATIVE
Nitrite, UA: NEGATIVE
Protein, UA: NEGATIVE
Specific Gravity, UA: 1.025 (ref 1.005–1.030)
UUROB: 1 mg/dL (ref 0.2–1.0)
pH, UA: 5.5 (ref 5.0–7.5)

## 2017-02-15 MED ORDER — FINASTERIDE 1 MG PO TABS: ORAL_TABLET | ORAL | 3 refills | Status: DC

## 2017-02-15 MED ORDER — LOSARTAN POTASSIUM 50 MG PO TABS: 50.0000 mg | ORAL_TABLET | Freq: Every day | ORAL | 3 refills | Status: DC

## 2017-02-15 NOTE — Progress Notes (Signed)
Subjective:    Patient ID: Brian Estes, male    DOB: July 11, 1961, 56 y.o.   MRN: [id number]  HPI Patient is here today for annual wellness exam and follow up of chronic medical problems which includes hyperlipidemia and hypertensin. He is taking medication regularly.The patient is doing well other than complaining with seasonal allergies. His family history was reviewed. His mother has had bypass surgery and is intolerant to statin drugs. His father has Parkinson's disease and prostate cancer.. The patient is going to need a TD Today. He is requesting refills on both of his medications he is currently taking. He is statin intolerant and cannot take sufficient amounts of vitamin D to get his level up to where it needs to be. The patient denies any chest pain or shortness of breath. He says he walks at least 6 miles daily on the average over the year he tracks this on his phone. He denies any chest pain or shortness of breath. He denies any trouble with swallowing heartburn indigestion nausea vomiting diarrhea or blood in the stool. He does have occasional stools with mucus and this is not changed from the past. He denies any trouble with passing his water other than he did say he passed a kidney stone in the past few days. He denies any burning pain or frequency and even with passing a kidney stone has not seen any obvious blood. As a follow-up we will check on when he needs his next colonoscopy. His lab work will be reviewed with him during the visit and his LDL particle number was [id number] with an LDL C being 142. Triglycerides were good. The good cholesterol was low. The CBC had a normal white blood cell count with a good hemoglobin at 14.4 and an adequate platelet count. The blood sugar was good at 89. The creatinine, was within normal limits along with all of the electrolytes. All the liver function tests were normal. The vitamin D for this patient remains low at 19.0 and he is taking his much vitamin D as he  says he can take currently. The PSA remains low and within normal limits at 0.4. All thyroid function tests were within normal limits.     Patient Active Problem List   Diagnosis Date Noted  . Family history of heart disease 01/16/2016  . Essential hypertension 06/26/2014  . Statin intolerance 12/11/2013  . BPH (benign prostatic hyperplasia) 12/11/2013  . Vitamin D deficiency 12/11/2013  . 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 B12 deficiency 05/24/2009  . Hyperlipidemia 05/21/2008  . CROHN'S DISEASE 05/21/2008  . DIVERTICULOSIS, COLON 05/21/2008  . CONSTIPATION 05/21/2008  . FATTY LIVER DISEASE 05/21/2008   Outpatient Encounter Prescriptions as of 02/15/2017  Medication Sig  . finasteride (PROPECIA) 1 MG tablet TAKE 1 TABLET (1 MG TOTAL) BY MOUTH DAILY.  Marland Kitchen losartan (COZAAR) 50 MG tablet TAKE 1 TABLET (50 MG TOTAL) BY MOUTH DAILY.   No facility-administered encounter medications on file as of 02/15/2017.       Review of Systems  Constitutional: Negative.   HENT: Negative.        Seasonal allergies  Eyes: Negative.   Respiratory: Negative.   Cardiovascular: Negative.   Gastrointestinal: Negative.   Endocrine: Negative.   Genitourinary: Negative.   Musculoskeletal: Negative.   Skin: Negative.   Allergic/Immunologic: Negative.   Neurological: Negative.   Hematological: Negative.   Psychiatric/Behavioral: Negative.        Objective:   Physical Exam  Constitutional: He is oriented  to person, place, and time. He appears well-developed and well-nourished. No distress.  HENT:  Head: Normocephalic and atraumatic.  Right Ear: External ear normal.  Left Ear: External ear normal.  Nose: Nose normal.  Mouth/Throat: Oropharynx is clear and moist. No oropharyngeal exudate.  Eyes: Conjunctivae and EOM are normal. Pupils are equal, round, and reactive to light. Right eye exhibits no discharge. Left eye exhibits no discharge. No scleral icterus.  Neck: Normal range of motion. Neck  supple. No thyromegaly present.  No bruits thyromegaly or anterior cervical adenopathy  Cardiovascular: Normal rate, regular rhythm, normal heart sounds and intact distal pulses.   No murmur heard. The heart is regular at 60/m.  Pulmonary/Chest: Effort normal and breath sounds normal. No respiratory distress. He has no wheezes. He has no rales. He exhibits no tenderness.  No chest wall masses Clear anteriorly and posteriorly and no axillary adenopathy  Abdominal: Soft. Bowel sounds are normal. He exhibits no mass. There is no tenderness. There is no rebound and no guarding.  No abdominal tenderness masses or organ enlargement bruits or inguinal adenopathy  Genitourinary: Rectum normal and penis normal.  Genitourinary Comments: Prostate is minimally enlarged but soft without lumps or masses. There are no rectal masses. There were no inguinal hernias present. The external genitalia were within normal limits.  Musculoskeletal: Normal range of motion. He exhibits no edema.  Lymphadenopathy:    He has no cervical adenopathy.  Neurological: He is alert and oriented to person, place, and time. He has normal reflexes. No cranial nerve deficit.  Skin: Skin is warm and dry. No rash noted.  Psychiatric: He has a normal mood and affect. His behavior is normal. Judgment and thought content normal.  Nursing note and vitals reviewed.   BP 122/77 (BP Location: Left Arm)   Pulse 64   Temp 98.5 F (36.9 C) (Oral)   Ht 5' 9"  (1.753 m)   Wt 204 lb (92.5 kg)   BMI 30.13 kg/m   EKG is done today with results pending==== EKG only showed sinus bradycardia.     Assessment & Plan:  1. Annual physical exam -The patient has persistent hyperlipidemia with a family history of heart disease. He exercises regularly and will try taking Livalo 2 mg once weekly and come in 3 months for a fasting lipid liver panel. -He will continue to get his prostate exam yearly because of the family history of prostate  cancer. -He will continue with his regular exercise regimen - Urinalysis, Complete - EKG 12-Lead  2. Essential hypertension -The blood pressure is good today. -He will continue with his current treatment and his exercise regimen and sodium restriction - EKG 12-Lead  3. Pure hypercholesterolemia -We will try Livalo 2 mg 1 weekly and he will come in and 3 months for repeat lipid liver panel fasting.  4. Vitamin D deficiency i the vitamin D level remains low and he will start taking vitamin D3 1000 units, the now brand and take one daily  5. Benign prostatic hyperplasia, unspecified whether lower urinary tract symptoms present -The patient is having no symptoms with passing his water. The prostate gland is slightly enlarged but no lumps or masses. - Urinalysis, Complete  6. Nephrolithiasis -The patient recently passed a stone we will check a urinalysis today.  7. Segmental colitis -We will check with the gastroenterologist and find out when he is due to his next colonoscopy.  8. Statin intolerance -We will have the patient try Livalo 2 mg weekly and give  him samples to take him come in in about 3 months for repeat lipid liver panel.  Meds ordered this encounter  Medications  . losartan (COZAAR) 50 MG tablet    Sig: Take 1 tablet (50 mg total) by mouth daily.    Dispense:  90 tablet    Refill:  3  . finasteride (PROPECIA) 1 MG tablet    Sig: TAKE 1 TABLET (1 MG TOTAL) BY MOUTH DAILY.    Dispense:  90 tablet    Refill:  3   Patient Instructions  Continue current medications. Continue good therapeutic lifestyle changes which include good diet and exercise. Fall precautions discussed with patient. If an FOBT was given today- please return it to our front desk. If you are over 72 years old - you may need Prevnar 58 or the adult Pneumonia vaccine.  **Flu shots are available--- please call and schedule a FLU-CLINIC appointment**  After your visit with [place] today you will receive  a survey in the mail or online from [employer] regarding your care with [place]. Please take a moment to fill this out. Your feedback is very important to [place] as you can help [place] better understand your patient needs as well as improve your experience and satisfaction. WE CARE ABOUT YOU!!!   The patient will try Livalo 2 mg 1 weekly and will come by in about 3 months and get a traditional lipid liver panel fasting. He will get an appointment with the ophthalmologist and make sure that they send [place] a copy of that report He will continue with his physical activity level as he is currently doing We will check with the gastroenterologist to make sure that he is not due a colonoscopy any time soon and let him know what the gastroenterologist says. The patient will try taking vitamin D3, 1000 units, 1 daily. I prefer the now brand which can be purchased at the drugstore in [place] or the pharmacy and Rudean Haskell MD

## 2017-02-15 NOTE — Patient Instructions (Addendum)
Continue current medications. Continue good therapeutic lifestyle changes which include good diet and exercise. Fall precautions discussed with patient. If an FOBT was given today- please return it to our front desk. If you are over 57 years old - you may need Prevnar 59 or the adult Pneumonia vaccine.  **Flu shots are available--- please call and schedule a FLU-CLINIC appointment**  After your visit with [place] today you will receive a survey in the mail or online from [employer] regarding your care with [place]. Please take a moment to fill this out. Your feedback is very important to [place] as you can help [place] better understand your patient needs as well as improve your experience and satisfaction. WE CARE ABOUT YOU!!!   The patient will try Livalo 2 mg 1 weekly and will come by in about 3 months and get a traditional lipid liver panel fasting. He will get an appointment with the ophthalmologist and make sure that they send [place] a copy of that report He will continue with his physical activity level as he is currently doing We will check with the gastroenterologist to make sure that he is not due a colonoscopy any time soon and let him know what the gastroenterologist says. The patient will try taking vitamin D3, 1000 units, 1 daily. I prefer the now brand which can be purchased at the drugstore in [place] or the pharmacy and [place]

## 2017-02-15 NOTE — Addendum Note (Signed)
Addended by: Zannie Cove on: 02/15/2017 08:37 AM   Modules accepted: Orders

## 2017-02-15 NOTE — Addendum Note (Signed)
Addended by: Shelbie Ammons on: 02/15/2017 11:24 AM   Modules accepted: Orders

## 2017-02-19 ENCOUNTER — Telehealth: Payer: Self-pay | Admitting: Family Medicine

## 2017-02-22 NOTE — Telephone Encounter (Signed)
Pt called  - some labs are not going to be paid by [facility] - notes made in chart = no more NMR's

## 2017-03-22 ENCOUNTER — Ambulatory Visit: Payer: BC Managed Care – PPO | Admitting: Family Medicine

## 2017-04-22 ENCOUNTER — Encounter: Payer: Self-pay | Admitting: Family Medicine

## 2017-04-22 ENCOUNTER — Ambulatory Visit (INDEPENDENT_AMBULATORY_CARE_PROVIDER_SITE_OTHER): Payer: BC Managed Care – PPO | Admitting: Family Medicine

## 2017-04-22 VITALS — BP 138/79 | HR 63 | Temp 98.4°F | Ht 69.0 in | Wt 204.0 lb

## 2017-04-22 DIAGNOSIS — D489 Neoplasm of uncertain behavior, unspecified: Secondary | ICD-10-CM

## 2017-04-22 NOTE — Patient Instructions (Addendum)
Keep a dressing over the area below the nipple on the left chest wall and use Polysporin ointment. If the lesion continues to recur we will excise it surgically the next time and we will call with the pathology of this lesion as soon as the pathology is returned. We will do a referral to the dermatologist for the lesion over the cartilage of the right ear. We will ask the patient to drop by in a couple weeks for me to look at the sites where these lesions were located to make sure they have resolved completely.

## 2017-04-22 NOTE — Addendum Note (Signed)
Addended by: Zannie Cove on: 04/22/2017 05:09 PM   Modules accepted: Orders

## 2017-04-22 NOTE — Progress Notes (Signed)
Subjective:    Patient ID: Brian Estes, male    DOB: 1961/03/05, 56 y.o.   MRN: [id number]  HPI Patient here for left chest mole removal.This is been a recurring problem for several years. When the nurse was cleaning the area it actually came off on its on and we basically did cryotherapy to the base and hope that this will take care of it once and for all. The patient tolerated the procedure well. He also has a lesion on his back that he wanted [place] to take care of at the same time and we did cryotherapy on this also. He tolerated both procedures well. He has another area on his right ear that is a slightly raised area and we will send him to dermatology for further evaluation of this.   Patient Active Problem List   Diagnosis Date Noted  . Family history of heart disease 01/16/2016  . Essential hypertension 06/26/2014  . Statin intolerance 12/11/2013  . BPH (benign prostatic hyperplasia) 12/11/2013  . Vitamin D deficiency 12/11/2013  . 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 B12 deficiency 05/24/2009  . Hyperlipidemia 05/21/2008  . CROHN'S DISEASE 05/21/2008  . DIVERTICULOSIS, COLON 05/21/2008  . CONSTIPATION 05/21/2008  . FATTY LIVER DISEASE 05/21/2008   Outpatient Encounter Prescriptions as of 04/22/2017  Medication Sig  . finasteride (PROPECIA) 1 MG tablet TAKE 1 TABLET (1 MG TOTAL) BY MOUTH DAILY.  Marland Kitchen losartan (COZAAR) 50 MG tablet Take 1 tablet (50 mg total) by mouth daily.   No facility-administered encounter medications on file as of 04/22/2017.       Review of Systems  Constitutional: Negative.   HENT: Negative.   Eyes: Negative.   Respiratory: Negative.   Cardiovascular: Negative.   Gastrointestinal: Negative.   Endocrine: Negative.   Genitourinary: Negative.   Musculoskeletal: Negative.   Skin: Negative.        Left chest mole  Allergic/Immunologic: Negative.   Neurological: Negative.   Hematological: Negative.   Psychiatric/Behavioral: Negative.          Objective:   Physical Exam  BP 138/79 (BP Location: Right Arm, Patient Position: Sitting, Cuff Size: Normal)   Pulse 63   Temp 98.4 F (36.9 C) (Oral)   Ht 5' 9"  (1.753 m)   Wt 204 lb (92.5 kg)   SpO2 99%   BMI 30.13 kg/m  The nevus of the left chest wall was actually removed when it was cleansed. The base was frozen with cryotherapy. The patient also had a vertical lesion of irritated skin of the right back and cryotherapy was also performed on this. A third lesion was noted on the right ear he will be referred to dermatology for this.      Assessment & Plan:  1. Neoplasm of uncertain behavior -This is sent for pathology.  2. Right ear lesion -Patient will be referred to dermatology for this.   Patient Instructions  Keep a dressing over the area below the nipple on the left chest wall and use Polysporin ointment. If the lesion continues to recur we will excise it surgically the next time and we will call with the pathology of this lesion as soon as the pathology is returned. We will do a referral to the dermatologist for the lesion over the cartilage of the right ear. We will ask the patient to drop by in a couple weeks for me to look at the sites where these lesions were located to make sure they have resolved completely.  Timmothy Sours  Jennette Bill MD

## 2017-04-26 LAB — PATHOLOGY

## 2017-10-04 ENCOUNTER — Telehealth: Payer: Self-pay | Admitting: Family Medicine

## 2017-10-04 NOTE — Telephone Encounter (Signed)
Need to see rash in order to rx something

## 2017-10-04 NOTE — Telephone Encounter (Signed)
Pt aware and offered to schedule appt but pt states he will wait and see how it is tomorrow. If it isn't any better he will call to get an appt.

## 2017-10-13 ENCOUNTER — Other Ambulatory Visit: Payer: Self-pay | Admitting: *Deleted

## 2017-10-13 MED ORDER — EPINEPHRINE 0.3 MG/0.3ML IJ SOAJ: 0.3000 mg | Freq: Once | INTRAMUSCULAR | 3 refills | Status: AC

## 2017-10-23 ENCOUNTER — Ambulatory Visit: Payer: BC Managed Care – PPO | Admitting: Family Medicine

## 2017-10-23 ENCOUNTER — Encounter: Payer: Self-pay | Admitting: Family Medicine

## 2017-10-23 VITALS — BP 138/85 | HR 64 | Temp 97.8°F | Ht 69.0 in | Wt 202.4 lb

## 2017-10-23 DIAGNOSIS — L259 Unspecified contact dermatitis, unspecified cause: Secondary | ICD-10-CM

## 2017-10-23 MED ORDER — TRIAMCINOLONE ACETONIDE 40 MG/ML IJ SUSP: 40.0000 mg | Freq: Once | INTRAMUSCULAR | Status: DC

## 2017-10-23 NOTE — Progress Notes (Signed)
   HPI  Patient presents today here with rash.  Patient had rash for about 2-3 weeks.  He states that he took 4 days of prednisone that was left over from a previous course and had good improvement but then recurrence of symptoms.  He describes it as itchy red rash primarily on the back, but also in the left lower leg. He does hunt and could have been exposed to poison oak.  He also notices that his wife recently changed fabric softeners which could be the problem.  Requests injection if possible because he states he tolerates that better than the pills  PMH: Smoking status noted ROS: Per HPI  Objective: BP 138/85 (BP Location: Left Arm, Patient Position: Sitting, Cuff Size: Large)   Pulse 64   Temp 97.8 F (36.6 C) (Oral)   Ht 5' 9"  (1.753 m)   Wt 202 lb 6.4 oz (91.8 kg)   BMI 29.89 kg/m  Gen: NAD, alert, cooperative with exam HEENT: NCAT CV: RRR, good S1/S2, no murmur Resp: CTABL, no wheezes, non-labored Ext: No edema, warm Neuro: Alert and oriented, No gross deficits  Skin Diffuse erythematous areas of macules and papules on the back, similar but smaller lesion in the left lower extremity  Assessment and plan:  #Contact dermatitis Given IM Kenalog in clinic today Discussed possible irritants, try to reduce or remove these if possible. Return to clinic as needed  Consider also atopic derm or xerosis but woith good response to PO prednisone likely contact    Meds ordered this encounter  Medications  . triamcinolone acetonide (KENALOG-40) injection 40 mg    Laroy Apple, MD [facility] 10/23/2017, 11:02 AM

## 2017-10-23 NOTE — Patient Instructions (Signed)
Great to meet you!   Contact Dermatitis Dermatitis is redness, soreness, and swelling (inflammation) of the skin. Contact dermatitis is a reaction to certain substances that touch the skin. You either touched something that irritated your skin, or you have allergies to something you touched. Follow these instructions at home: [place] your skin as needed.  Apply cool compresses to the affected areas.  Try taking a bath with: ? Epsom salts. Follow the instructions on the package. You can get these at a pharmacy or grocery store. ? Baking soda. Pour a small amount into the bath as told by your doctor. ? Colloidal oatmeal. Follow the instructions on the package. You can get this at a pharmacy or grocery store.  Try applying baking soda paste to your skin. Stir water into baking soda until it looks like paste.  Do not scratch your skin.  Bathe less often.  Bathe in lukewarm water. Avoid using hot water. Medicines  Take or apply over-the-counter and prescription medicines only as told by your doctor.  If you were prescribed an antibiotic medicine, take or apply your antibiotic as told by your doctor. Do not stop taking the antibiotic even if your condition starts to get better. General instructions  Keep all follow-up visits as told by your doctor. This is important.  Avoid the substance that caused your reaction. If you do not know what caused it, keep a journal to try to track what caused it. Write down: ? What you eat. ? What cosmetic products you use. ? What you drink. ? What you wear in the affected area. This includes jewelry.  If you were given a bandage (dressing), take care of it as told by your doctor. This includes when to change and remove it. Contact a doctor if:  You do not get better with treatment.  Your condition gets worse.  You have signs of infection such as: ? Swelling. ? Tenderness. ? Redness. ? Soreness. ? Warmth.  You have a  fever.  You have new symptoms. Get help right away if:  You have a very bad headache.  You have neck pain.  Your neck is stiff.  You throw up (vomit).  You feel very sleepy.  You see red streaks coming from the affected area.  Your bone or joint underneath the affected area becomes painful after the skin has healed.  The affected area turns darker.  You have trouble breathing. This information is not intended to replace advice given to you by your health care provider. Make sure you discuss any questions you have with your health care provider. Document Released: 08/23/2009 Document Revised: 04/02/2016 Document Reviewed: 03/13/2015 Elsevier Interactive Patient Education  2018 [employer].

## 2017-10-25 ENCOUNTER — Ambulatory Visit: Payer: BC Managed Care – PPO | Admitting: Family Medicine

## 2018-01-21 ENCOUNTER — Ambulatory Visit: Payer: BC Managed Care – PPO | Admitting: Family Medicine

## 2018-01-22 ENCOUNTER — Ambulatory Visit: Payer: BC Managed Care – PPO | Admitting: Family Medicine

## 2018-01-22 ENCOUNTER — Encounter: Payer: Self-pay | Admitting: Family Medicine

## 2018-01-22 VITALS — BP 129/84 | HR 69 | Temp 97.1°F | Ht 69.0 in | Wt 205.8 lb

## 2018-01-22 DIAGNOSIS — R0981 Nasal congestion: Secondary | ICD-10-CM

## 2018-01-22 MED ORDER — BETAMETHASONE SOD PHOS & ACET 6 (3-3) MG/ML IJ SUSP
6.0000 mg | Freq: Once | INTRAMUSCULAR | Status: AC
  Administered 2018-01-22: 6 mg via INTRAMUSCULAR

## 2018-01-22 NOTE — Progress Notes (Signed)
Chief Complaint  Patient presents with  . Sinus Problem    HPI  Patient presents today f with upper respiratory congestion. Rhinorrhea that is scant. Moderate drainage. He had some sinus pressure in the cheeks yesterday. There is no sore throat. Patient reports coughing occasionally. No sputum noted. There is no fever, chills or sweats. The patient denies being short of breath. Onset was 5 days ago. Stable. Due to Crohn's history, he prefers a shot to pills PMH: Smoking status noted ROS: Per HPI  Objective: BP 129/84 (BP Location: Right Arm, Cuff Size: Normal)   Pulse 69   Temp (!) 97.1 F (36.2 C) (Oral)   Ht 5' 9"  (1.753 m)   Wt 205 lb 12.8 oz (93.4 kg)   BMI 30.39 kg/m  Gen: NAD, alert, cooperative with exam HEENT: NCAT, Nasal passages swollen, red TMS RED CV: RRR, good S1/S2, no murmur Resp: Bronchitis changes with scattered wheezes, non-labored Ext: No edema, warm Neuro: Alert and oriented, No gross deficits  Assessment and plan:  1. Congestion of paranasal sinus     Meds ordered this encounter  Medications  . betamethasone acetate-betamethasone sodium phosphate (CELESTONE) injection 6 mg    No orders of the defined types were placed in this encounter.   Follow up as needed.  Claretta Fraise, MD

## 2018-02-08 ENCOUNTER — Telehealth: Payer: Self-pay | Admitting: Family Medicine

## 2018-02-09 ENCOUNTER — Other Ambulatory Visit: Payer: Self-pay | Admitting: *Deleted

## 2018-02-09 DIAGNOSIS — Z0000 Encounter for general adult medical examination without abnormal findings: Secondary | ICD-10-CM

## 2018-02-09 DIAGNOSIS — I10 Essential (primary) hypertension: Secondary | ICD-10-CM

## 2018-02-09 DIAGNOSIS — N400 Enlarged prostate without lower urinary tract symptoms: Secondary | ICD-10-CM

## 2018-02-09 DIAGNOSIS — E559 Vitamin D deficiency, unspecified: Secondary | ICD-10-CM

## 2018-02-09 DIAGNOSIS — E7800 Pure hypercholesterolemia, unspecified: Secondary | ICD-10-CM

## 2018-02-09 NOTE — Telephone Encounter (Signed)
Pt aware - orders placed

## 2018-02-12 ENCOUNTER — Other Ambulatory Visit: Payer: BC Managed Care – PPO

## 2018-02-12 DIAGNOSIS — Z0000 Encounter for general adult medical examination without abnormal findings: Secondary | ICD-10-CM

## 2018-02-12 DIAGNOSIS — N400 Enlarged prostate without lower urinary tract symptoms: Secondary | ICD-10-CM

## 2018-02-12 DIAGNOSIS — E559 Vitamin D deficiency, unspecified: Secondary | ICD-10-CM

## 2018-02-12 DIAGNOSIS — I10 Essential (primary) hypertension: Secondary | ICD-10-CM

## 2018-02-12 DIAGNOSIS — E7800 Pure hypercholesterolemia, unspecified: Secondary | ICD-10-CM

## 2018-02-15 LAB — CBC WITH DIFFERENTIAL/PLATELET
BASOS ABS: 0.1 10*3/uL (ref 0.0–0.2)
Basos: 1 %
EOS (ABSOLUTE): 0.2 10*3/uL (ref 0.0–0.4)
Eos: 3 %
Hematocrit: 44.9 % (ref 37.5–51.0)
Hemoglobin: 14.7 g/dL (ref 13.0–17.7)
Immature Grans (Abs): 0 10*3/uL (ref 0.0–0.1)
Immature Granulocytes: 0 %
LYMPHS ABS: 1.8 10*3/uL (ref 0.7–3.1)
LYMPHS: 30 %
MCH: 30.9 pg (ref 26.6–33.0)
MCHC: 32.7 g/dL (ref 31.5–35.7)
MCV: 95 fL (ref 79–97)
Monocytes Absolute: 0.3 10*3/uL (ref 0.1–0.9)
Monocytes: 5 %
NEUTROS ABS: 3.7 10*3/uL (ref 1.4–7.0)
Neutrophils: 61 %
PLATELETS: 339 10*3/uL (ref 150–379)
RBC: 4.75 x10E6/uL (ref 4.14–5.80)
RDW: 13.4 % (ref 12.3–15.4)
WBC: 6.1 10*3/uL (ref 3.4–10.8)

## 2018-02-15 LAB — LIPID PANEL
CHOL/HDL RATIO: 5.6 ratio — AB (ref 0.0–5.0)
Cholesterol, Total: 217 mg/dL — ABNORMAL HIGH (ref 100–199)
HDL: 39 mg/dL — AB (ref >39)
LDL CALC: 158 mg/dL — AB (ref 0–99)
TRIGLYCERIDES: 98 mg/dL (ref 0–149)
VLDL Cholesterol Cal: 20 mg/dL (ref 5–40)

## 2018-02-15 LAB — BMP8+EGFR
BUN / CREAT RATIO: 15 (ref 9–20)
BUN: 13 mg/dL (ref 6–24)
CHLORIDE: 105 mmol/L (ref 96–106)
CO2: 23 mmol/L (ref 20–29)
Calcium: 9.5 mg/dL (ref 8.7–10.2)
Creatinine, Ser: 0.86 mg/dL (ref 0.76–1.27)
GFR calc Af Amer: 111 mL/min/{1.73_m2} (ref >59)
GFR calc non Af Amer: 96 mL/min/{1.73_m2} (ref >59)
GLUCOSE: 84 mg/dL (ref 65–99)
Potassium: 4.5 mmol/L (ref 3.5–5.2)
SODIUM: 143 mmol/L (ref 134–144)

## 2018-02-15 LAB — HEPATIC FUNCTION PANEL
ALBUMIN: 4.2 g/dL (ref 3.5–5.5)
ALT: 37 IU/L (ref 0–44)
AST: 24 IU/L (ref 0–40)
Alkaline Phosphatase: 91 IU/L (ref 39–117)
BILIRUBIN TOTAL: 0.7 mg/dL (ref 0.0–1.2)
Bilirubin, Direct: 0.14 mg/dL (ref 0.00–0.40)
Total Protein: 7.4 g/dL (ref 6.0–8.5)

## 2018-02-15 LAB — PSA, TOTAL AND FREE
PSA FREE: 0.08 ng/mL
PSA, Free Pct: 26.7 %
Prostate Specific Ag, Serum: 0.3 ng/mL (ref 0.0–4.0)

## 2018-02-15 LAB — VITAMIN D 25 HYDROXY (VIT D DEFICIENCY, FRACTURES): Vit D, 25-Hydroxy: 17.9 ng/mL — ABNORMAL LOW (ref 30.0–100.0)

## 2018-02-16 ENCOUNTER — Telehealth: Payer: Self-pay | Admitting: Family Medicine

## 2018-02-16 ENCOUNTER — Ambulatory Visit (INDEPENDENT_AMBULATORY_CARE_PROVIDER_SITE_OTHER): Payer: BC Managed Care – PPO

## 2018-02-16 ENCOUNTER — Ambulatory Visit (INDEPENDENT_AMBULATORY_CARE_PROVIDER_SITE_OTHER): Payer: BC Managed Care – PPO | Admitting: Family Medicine

## 2018-02-16 ENCOUNTER — Encounter: Payer: Self-pay | Admitting: Family Medicine

## 2018-02-16 VITALS — BP 127/76 | HR 58 | Temp 97.5°F | Ht 69.0 in | Wt 202.0 lb

## 2018-02-16 DIAGNOSIS — N400 Enlarged prostate without lower urinary tract symptoms: Secondary | ICD-10-CM

## 2018-02-16 DIAGNOSIS — I10 Essential (primary) hypertension: Secondary | ICD-10-CM

## 2018-02-16 DIAGNOSIS — K5010 Crohn's disease of large intestine without complications: Secondary | ICD-10-CM

## 2018-02-16 DIAGNOSIS — Z0000 Encounter for general adult medical examination without abnormal findings: Secondary | ICD-10-CM | POA: Diagnosis not present

## 2018-02-16 DIAGNOSIS — E7800 Pure hypercholesterolemia, unspecified: Secondary | ICD-10-CM

## 2018-02-16 DIAGNOSIS — E538 Deficiency of other specified B group vitamins: Secondary | ICD-10-CM

## 2018-02-16 DIAGNOSIS — E559 Vitamin D deficiency, unspecified: Secondary | ICD-10-CM

## 2018-02-16 DIAGNOSIS — Z789 Other specified health status: Secondary | ICD-10-CM

## 2018-02-16 LAB — URINALYSIS, COMPLETE
Bilirubin, UA: NEGATIVE
Glucose, UA: NEGATIVE
Ketones, UA: NEGATIVE
Leukocytes, UA: NEGATIVE
NITRITE UA: NEGATIVE
PH UA: 6 (ref 5.0–7.5)
Protein, UA: NEGATIVE
Specific Gravity, UA: 1.02 (ref 1.005–1.030)
Urobilinogen, Ur: 2 mg/dL — ABNORMAL HIGH (ref 0.2–1.0)

## 2018-02-16 LAB — MICROSCOPIC EXAMINATION
BACTERIA UA: NONE SEEN
EPITHELIAL CELLS (NON RENAL): NONE SEEN /HPF (ref 0–10)
Renal Epithel, UA: NONE SEEN /hpf
WBC, UA: NONE SEEN /hpf (ref 0–5)

## 2018-02-16 MED ORDER — MOMETASONE FUROATE 0.1 % EX CREA: 1.0000 "application " | TOPICAL_CREAM | Freq: Every day | CUTANEOUS | 1 refills | Status: DC

## 2018-02-16 MED ORDER — ROSUVASTATIN CALCIUM 5 MG PO TABS: 2.5000 mg | ORAL_TABLET | ORAL | 3 refills | Status: DC

## 2018-02-16 MED ORDER — MOMETASONE FUROATE 0.1 % EX OINT: TOPICAL_OINTMENT | Freq: Every day | CUTANEOUS | 1 refills | Status: DC

## 2018-02-16 NOTE — Telephone Encounter (Signed)
Pt aware.

## 2018-02-16 NOTE — Addendum Note (Signed)
Addended by: Zannie Cove on: 02/16/2018 08:53 AM   Modules accepted: Orders

## 2018-02-16 NOTE — Patient Instructions (Addendum)
Continue current medications. Continue good therapeutic lifestyle changes which include good diet and exercise. Fall precautions discussed with patient. If an FOBT was given today- please return it to our front desk. If you are over 58 years old - you may need Prevnar 67 or the adult Pneumonia vaccine.  **Flu shots are available--- please call and schedule a FLU-CLINIC appointment**  After your visit with [place] today you will receive a survey in the mail or online from [employer] regarding your care with [place]. Please take a moment to fill this out. Your feedback is very important to [place] as you can help [place] better understand your patient needs as well as improve your experience and satisfaction. WE CARE ABOUT YOU!!!   We will arrange for you to have an appointment with a gastroenterologist, Dr. Billie Lade Continue to walk and exercise regularly Return the FOBT as recommended We will call with a chest x-ray results as soon as they become available Consider starting and trying at least 1/2 pill of Crestor 5 mg once weekly.  If this causes increased muscle aches and myalgias discontinue the medicine. Continue to drink plenty of fluids and stay well-hydrated Avoid soaps fabric softeners and detergents that are scented Start vitamin D3, 1000 units, 1 daily.  I prefer the now brand which can be purchased at the drugstore in [place] or the pharmacy in [place].

## 2018-02-16 NOTE — Progress Notes (Signed)
Subjective:    Patient ID: Brian Estes, male    DOB: [birth year], 57 y.o.   MRN: [id number]  HPI Patient is here today for annual wellness exam and follow up of chronic medical problems which includes hypertension and hyperlipidemia. He is taking medication regularly.  The patient is doing well overall he does, complaint of a rash on his back.  He is due for a physical exam today and is requesting a refill on Elocon cream.  His vital signs are stable and his weight is down 4 pounds.  His body mass index remains elevated at 30.39.  This patient has a history of Crohn's disease and sees the gastroenterologist regularly.  He also has hyperlipidemia and is very sensitive to statin drugs and cannot take a lot of statin because of muscle aches and myalgias.  He takes losartan for his blood pressure.  Both of his parents are living.  His father and his mother both have a lot of issues with arthritis in their back with neuropathy.  His mother has had bypass surgery.  His father also sees the cardiologist and has atrial fibrillation.  He is the only child.  The patient has had blood work done and this will be reviewed with him during the visit today.  His LDL C remains elevated at 158 and the good cholesterol remains low at 39 with normal triglycerides at 98.  The PSA remains low at 0.3.  All liver function tests are normal.  Vitamin D level remains low at 17.9.  The CBC has a normal white blood cell count with a good hemoglobin at 14.7 and an adequate platelet count at 339,000.  The blood sugar is good at 84 and the creatinine within normal limits at 0.86 with normal electrolytes.  This will be reviewed with him during the visit today.  The patient walks regularly and tries to watch his diet closely.  He really does not like going to see any doctor any time.  When he last had his colonoscopy with 1 of the gastroenterologist that has retired he was told he did not have to come back for 10 years but he did agree today  to go back and see the gastroenterologist to make sure that this follow-up is appropriate.  He denies any chest pain pressure or tightness.  He denies any trouble or any changes with his bowel habits any more than he has had in the past.  He does have some internal hemorrhoids and does occasionally see some blood in the stool.  There is not been any change in his diet habits nausea vomiting diarrhea.  He is passing his water without problems.  Ultimately, he did agree to go back and see the gastroenterologist to make sure that a colonoscopy repeat sometime in 2022 is the appropriate time.  He does complain with a rash has been using some cream on his back and this is better.  We did discuss his positive family history for heart disease and he may be willing to try a half of a Crestor 5 mg p.o. once a week to see if this works well for him and does not cause any problems.  He is up-to-date on his eye exams.      Patient Active Problem List   Diagnosis Date Noted  . Family history of heart disease 01/16/2016  . Essential hypertension 06/26/2014  . Statin intolerance 12/11/2013  . BPH (benign prostatic hyperplasia) 12/11/2013  . Vitamin D deficiency 12/11/2013  .  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 B12 deficiency 05/24/2009  . Hyperlipidemia 05/21/2008  . CROHN'S DISEASE 05/21/2008  . DIVERTICULOSIS, COLON 05/21/2008  . CONSTIPATION 05/21/2008  . FATTY LIVER DISEASE 05/21/2008   Outpatient Encounter Medications as of 02/16/2018  Medication Sig  . finasteride (PROPECIA) 1 MG tablet TAKE 1 TABLET (1 MG TOTAL) BY MOUTH DAILY.  Marland Kitchen losartan (COZAAR) 50 MG tablet Take 1 tablet (50 mg total) by mouth daily.  . mometasone (ELOCON) 0.1 % cream Apply 1 application topically daily.  . mometasone (ELOCON) 0.1 % ointment Apply topically daily.  . [DISCONTINUED] triamcinolone acetonide (KENALOG-40) injection 40 mg    No facility-administered encounter medications on file as of 02/16/2018.      Review  of Systems  Constitutional: Negative.   HENT: Negative.   Eyes: Negative.   Respiratory: Negative.   Cardiovascular: Negative.   Gastrointestinal: Negative.   Endocrine: Negative.   Genitourinary: Negative.   Musculoskeletal: Negative.   Skin: Positive for rash (on back only).  Allergic/Immunologic: Negative.   Neurological: Negative.   Hematological: Negative.   Psychiatric/Behavioral: Negative.        Objective:   Physical Exam  Constitutional: He is oriented to person, place, and time. He appears well-developed and well-nourished. No distress.  The patient is pleasant and relaxed and try to exercise and follow his diet closely.  HENT:  Head: Normocephalic and atraumatic.  Right Ear: External ear normal.  Left Ear: External ear normal.  Nose: Nose normal.  Mouth/Throat: Oropharynx is clear and moist. No oropharyngeal exudate.  Eyes: Pupils are equal, round, and reactive to light. Conjunctivae and EOM are normal. Right eye exhibits no discharge. Left eye exhibits no discharge. No scleral icterus.  Neck: Normal range of motion. Neck supple. No thyromegaly present.  No bruits thyromegaly or anterior cervical adenopathy  Cardiovascular: Normal rate, regular rhythm, normal heart sounds and intact distal pulses.  No murmur heard. The heart is regular at 72/min  Pulmonary/Chest: Effort normal and breath sounds normal. No respiratory distress. He has no wheezes. He has no rales. He exhibits no tenderness.  Clear anteriorly and posteriorly and no axillary adenopathy or chest wall masses  Abdominal: Soft. Bowel sounds are normal. He exhibits no mass. There is no tenderness. There is no rebound and no guarding.  No abdominal tenderness liver or spleen enlargement inguinal adenopathy or bruits.  No masses.  No tenderness.  Genitourinary: Rectum normal and penis normal.  Genitourinary Comments: The prostate is only minimally enlarged with no lumps or masses and no rectal masses being  palpated.  There were no inguinal hernias palpable and the external genitalia were within normal limits.  Musculoskeletal: Normal range of motion. He exhibits no edema.  Lymphadenopathy:    He has no cervical adenopathy.  Neurological: He is alert and oriented to person, place, and time. He has normal reflexes. No cranial nerve deficit.  Skin: Skin is warm and dry. Rash noted. No erythema. No pallor.  Small area of atopic dermatitis on right upper back and left upper back.  He will continue with steroid creams as needed and will avoid soaps fabric softeners and detergents that are scented  Psychiatric: He has a normal mood and affect. His behavior is normal. Judgment and thought content normal.  Nursing note and vitals reviewed.   BP 127/76 (BP Location: Left Arm)   Pulse (!) 58   Temp (!) 97.5 F (36.4 C) (Oral)   Ht 5' 9"  (1.753 m)   Wt 202 lb (91.6  kg)   BMI 29.83 kg/m    Chest x-ray with results pending===     Assessment & Plan:  1. Annual physical exam -Patient agrees to go see the gastroenterologist to decide on the appropriate time for his next colonoscopy based on his past history. -He will return his FOBT and get a chest x-ray today. - Urinalysis, Complete  2. Pure hypercholesterolemia -Try Crestor 5 mg one half p.o. weekly and if this is not tolerated well he will discontinue this. -Continue with aggressive therapeutic lifestyle changes and weight loss to achieve a lower BMI - DG Chest 2 View; Future  3. Essential hypertension -Continue current treatment and sodium restriction - DG Chest 2 View; Future  4. Benign prostatic hyperplasia, unspecified whether lower urinary tract symptoms present -No problems with voiding or with sex life. - Urinalysis, Complete  5. Vitamin D deficiency -Start D3, 1000 units, the now brand, and take 1 daily  6. Statin intolerance -Try Crestor 5 mg one half p.o. once weekly  7. 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 B12 deficiency -Add B12 level to most recent lab work  Meds ordered this encounter  Medications  . mometasone (ELOCON) 0.1 % cream    Sig: Apply 1 application topically daily.    Dispense:  45 g    Refill:  1  . mometasone (ELOCON) 0.1 % ointment    Sig: Apply topically daily.    Dispense:  45 g    Refill:  1   Patient Instructions  Continue current medications. Continue good therapeutic lifestyle changes which include good diet and exercise. Fall precautions discussed with patient. If an FOBT was given today- please return it to our front desk. If you are over 7 years old - you may need Prevnar 32 or the adult Pneumonia vaccine.  **Flu shots are available--- please call and schedule a FLU-CLINIC appointment**  After your visit with [place] today you will receive a survey in the mail or online from [employer] regarding your care with [place]. Please take a moment to fill this out. Your feedback is very important to [place] as you can help [place] better understand your patient needs as well as improve your experience and satisfaction. WE CARE ABOUT YOU!!!   We will arrange for you to have an appointment with a gastroenterologist, Dr. Billie Lade Continue to walk and exercise regularly Return the FOBT as recommended We will call with a chest x-ray results as soon as they become available Consider starting and trying at least 1/2 pill of Crestor 5 mg once weekly.  If this causes increased muscle aches and myalgias discontinue the medicine. Continue to drink plenty of fluids and stay well-hydrated Avoid soaps fabric softeners and detergents that are scented  Arrie Senate MD

## 2018-02-17 ENCOUNTER — Encounter: Payer: Self-pay | Admitting: Internal Medicine

## 2018-02-17 ENCOUNTER — Telehealth: Payer: Self-pay | Admitting: Internal Medicine

## 2018-02-17 NOTE — Telephone Encounter (Signed)
ok 

## 2018-02-26 ENCOUNTER — Other Ambulatory Visit: Payer: Self-pay | Admitting: Family Medicine

## 2018-03-25 ENCOUNTER — Other Ambulatory Visit: Payer: Self-pay | Admitting: Family Medicine

## 2018-04-11 ENCOUNTER — Telehealth: Payer: Self-pay | Admitting: *Deleted

## 2018-04-11 MED ORDER — ROSUVASTATIN CALCIUM 5 MG PO TABS: 5.0000 mg | ORAL_TABLET | ORAL | 3 refills | Status: DC

## 2018-04-11 NOTE — Telephone Encounter (Signed)
Med sent in.

## 2018-04-29 ENCOUNTER — Ambulatory Visit: Payer: BC Managed Care – PPO | Admitting: Family Medicine

## 2018-04-29 ENCOUNTER — Encounter: Payer: Self-pay | Admitting: Family Medicine

## 2018-04-29 VITALS — BP 133/79 | HR 59 | Temp 98.3°F | Ht 69.0 in | Wt 203.0 lb

## 2018-04-29 DIAGNOSIS — H938X2 Other specified disorders of left ear: Secondary | ICD-10-CM

## 2018-04-29 NOTE — Progress Notes (Signed)
Subjective: CC: Left ear PCP: Brian Herb, MD [id number] Brian Estes is a 57 y.o. male presenting to clinic today for:  1. Left ear fullness Patient reports onset of left ear fullness about 1 to 2 days ago.  He describes it as hearing fluid moving around in his left ear.  Denies any overt pain.  No drainage from the ear.  No fevers, chills, nausea, vomiting, diarrhea or cough.  He reports associated head congestion.  No recent swimming.  He has not used anything for symptoms yet.  He is going on a trip out of town and was worried about this developing into an infection. PMH HTN.   ROS: Per HPI  Allergies  Allergen Reactions  . Aspirin   . Lipitor [Atorvastatin] Other (See Comments)    myalgia   Past Medical History:  Diagnosis Date  . Colitis   . Diverticulosis of colon (without mention of hemorrhage)   . Hyperlipemia   . Hypertension   . Kidney disease   . Other chronic nonalcoholic liver disease   . Regional enteritis of unspecified site   . Vitamin B12 deficiency     Current Outpatient Medications:  .  finasteride (PROPECIA) 1 MG tablet, TAKE 1 TABLET (1 MG TOTAL) BY MOUTH DAILY., Disp: 90 tablet, Rfl: 1 .  losartan (COZAAR) 50 MG tablet, TAKE 1 TABLET (50 MG TOTAL) BY MOUTH DAILY., Disp: 90 tablet, Rfl: 1 .  mometasone (ELOCON) 0.1 % cream, Apply 1 application topically daily., Disp: 45 g, Rfl: 1 .  mometasone (ELOCON) 0.1 % ointment, Apply topically daily., Disp: 45 g, Rfl: 1 .  rosuvastatin (CRESTOR) 5 MG tablet, Take 1 tablet (5 mg total) by mouth once a week., Disp: 12 tablet, Rfl: 3 Social History   Socioeconomic History  . Marital status: Married    Spouse name: Not on file  . Number of children: 2  . Years of education: Not on file  . Highest education level: Not on file  Occupational History  . Occupation: self employed  Social Needs  . Financial resource strain: Not on file  . Food insecurity:    Worry: Not on file    Inability: Not on file  .  Transportation needs:    Medical: Not on file    Non-medical: Not on file  Tobacco Use  . Smoking status: Never Smoker  . Smokeless tobacco: Never Used  Substance and Sexual Activity  . Alcohol use: Yes    Comment: rare  . Drug use: No  . Sexual activity: Not on file  Lifestyle  . Physical activity:    Days per week: Not on file    Minutes per session: Not on file  . Stress: Not on file  Relationships  . Social connections:    Talks on phone: Not on file    Gets together: Not on file    Attends religious service: Not on file    Active member of club or organization: Not on file    Attends meetings of clubs or organizations: Not on file    Relationship status: Not on file  . Intimate partner violence:    Fear of current or ex partner: Not on file    Emotionally abused: Not on file    Physically abused: Not on file    Forced sexual activity: Not on file  Other Topics Concern  . Not on file  Social History Narrative  . Not on file   Family History  Problem Relation  Age of Onset  . Heart disease Mother   . Parkinson's disease Father   . Colon cancer Neg Hx   . Esophageal cancer Neg Hx   . Stomach cancer Neg Hx     Objective: Office vital signs reviewed. BP 133/79   Pulse (!) 59   Temp 98.3 F (36.8 C) (Oral)   Ht 5' 9"  (1.753 m)   Wt 203 lb (92.1 kg)   BMI 29.98 kg/m   Physical Examination:  General: Awake, alert, well nourished, No acute distress HEENT: Normal    Neck: No masses palpated. No lymphadenopathy    Ears: Tympanic membranes intact, normal light reflex, no erythema, no bulging    Eyes: PERRLA, extraocular membranes intact, sclera white    Nose: nasal turbinates moist, no nasal discharge    Throat: moist mucus membranes, no erythema, no tonsillar exudate.  Airway is patent Pulm:  normal work of breathing on room air   Assessment/ Plan: 57 y.o. male   1. Sensation of fullness in left ear No evidence of infection on today's exam.  I suspect  that his symptoms are related to allergies at this point.  I did recommend that he use an OTC allergy medication starting now.  He has Allegra at home and plans to use this.  He wants to know if Allegra-D would be okay for a couple of days.  We discussed that the decongestion component could increase the blood pressure and to monitor this very closely.  Would not use more than the next 2 to 3 days.  We also discussed that the plane ride may increase pressure within the ears.  He will be conscientious of this.  I gave him my card and instructed him to call the office if he develops any worrisome symptoms or signs of infection while on his trip.  However, I do not think an antibiotic is needed at this time.  Patient was good understanding will follow-up as needed.  Brian Norlander, DO [place] [phone]

## 2018-05-05 ENCOUNTER — Ambulatory Visit: Payer: Self-pay | Admitting: Internal Medicine

## 2018-05-05 ENCOUNTER — Encounter: Payer: Self-pay | Admitting: *Deleted

## 2018-05-19 ENCOUNTER — Telehealth: Payer: Self-pay | Admitting: Internal Medicine

## 2018-05-19 ENCOUNTER — Ambulatory Visit: Payer: BC Managed Care – PPO | Admitting: Internal Medicine

## 2018-05-19 ENCOUNTER — Encounter: Payer: Self-pay | Admitting: Internal Medicine

## 2018-05-19 ENCOUNTER — Other Ambulatory Visit (INDEPENDENT_AMBULATORY_CARE_PROVIDER_SITE_OTHER): Payer: BC Managed Care – PPO

## 2018-05-19 VITALS — BP 126/82 | HR 52 | Ht 70.0 in | Wt 201.0 lb

## 2018-05-19 DIAGNOSIS — K5010 Crohn's disease of large intestine without complications: Secondary | ICD-10-CM

## 2018-05-19 DIAGNOSIS — K5730 Diverticulosis of large intestine without perforation or abscess without bleeding: Secondary | ICD-10-CM | POA: Diagnosis not present

## 2018-05-19 DIAGNOSIS — K5900 Constipation, unspecified: Secondary | ICD-10-CM

## 2018-05-19 DIAGNOSIS — E538 Deficiency of other specified B group vitamins: Secondary | ICD-10-CM | POA: Diagnosis not present

## 2018-05-19 LAB — VITAMIN B12: Vitamin B-12: 137 pg/mL — ABNORMAL LOW (ref 211–911)

## 2018-05-19 LAB — IGA: IgA: 448 mg/dL — ABNORMAL HIGH (ref 68–378)

## 2018-05-19 LAB — HIGH SENSITIVITY CRP: CRP, High Sensitivity: 2.85 mg/L (ref 0.000–5.000)

## 2018-05-19 MED ORDER — LINACLOTIDE 72 MCG PO CAPS: 72.0000 ug | ORAL_CAPSULE | Freq: Every day | ORAL | 3 refills | Status: DC

## 2018-05-19 MED ORDER — SUPREP BOWEL PREP KIT 17.5-3.13-1.6 GM/177ML PO SOLN: 1.0000 | ORAL | 0 refills | Status: DC

## 2018-05-19 NOTE — Progress Notes (Signed)
Patient ID: Brian Estes, male   DOB: 27-Aug-1961, 57 y.o.   MRN: [id number] HPI: [name] is a 57 year old male with a past medical history of left-sided segmental colitis, severe diverticulosis, B12 deficiency, hypertension, hyperlipidemia who is seen in consultation at the request of Dr. Laurance Flatten to reestablish care  for his left-sided colitis.  He is here alone today.  He has a history of left-sided colitis and was managed by Dr. Sharlett Iles in years past, but has not been seen since 2012.  On reviewing his records he has had a left-sided colitis seen during colonoscopy and histologically since 11/08/2001, seen again 09/13/2006 and last during most recent colonoscopy in August 2012.  Per notes from Dr. Sharlett Iles which were reviewed today he had been tried on Azulfidine which he did not tolerate.  He does recall that Pentasa helped him but he has not been on oral 5-ASA therapy for many years.  He recalls a medication which he took only once per day which seemed to "make me very sick and dried things up too much" which I feel was probabl Lialda given he only took it once daily (2 tabs).  He reports currently he is not dealing with severe abdominal symptoms and states it is much better than years ago when he was diagnosed.  He still has intermittent crampy pain triggered by some foods such as peanut butter, onions,  cereal.  Oatmeal and some wheat products also bother him and intermittently contribute to rash around his eye.  Fiber at times makes his abdominal symptoms and cramping worse.  He reports a long-standing history of constipation.  Bowel movements are usually daily but only fully complete 2 to 3 days/week.  Other days bowel movements are smaller.  He does deal intermittently with hemorrhoidal bleeding.  He reports a stable weight.  Again he is not having abdominal pain at this point.  Occasional heartburn but no dysphagia or odynophagia.  He is very active walking 3 miles a day outside.  He reports the  walk involves hills.  There is no family history of colon cancer or IBD.  He does not smoke, never has.  No alcohol.  He does report a history of B12 deficiency and states that Dr. Sharlett Iles had previously recommended supplementation.  He is not taking B12.  He also associates B12 supplementation with weight gain.  He also states his son has an issue with B12 deficiency  Past Medical History:  Diagnosis Date  . Colitis   . Diverticulosis of colon (without mention of hemorrhage)   . Hyperlipemia   . Hypertension   . Kidney disease   . Other chronic nonalcoholic liver disease   . Regional enteritis of unspecified site   . Vitamin B12 deficiency   . Vitamin D deficiency     Past Surgical History:  Procedure Laterality Date  . APPENDECTOMY  2001  . INGUINAL HERNIA REPAIR  1976  . KNEE SURGERY  1999   right    Outpatient Medications Prior to Visit  Medication Sig Dispense Refill  . Cholecalciferol (VITAMIN D PO) Take 1,000 Units by mouth.    . finasteride (PROPECIA) 1 MG tablet TAKE 1 TABLET (1 MG TOTAL) BY MOUTH DAILY. 90 tablet 1  . losartan (COZAAR) 50 MG tablet TAKE 1 TABLET (50 MG TOTAL) BY MOUTH DAILY. 90 tablet 1  . mometasone (ELOCON) 0.1 % cream Apply 1 application topically daily. 45 g 1  . mometasone (ELOCON) 0.1 % ointment Apply topically daily. 45 g  1  . rosuvastatin (CRESTOR) 5 MG tablet Take 1 tablet (5 mg total) by mouth once a week. 12 tablet 3   No facility-administered medications prior to visit.     Allergies  Allergen Reactions  . Aspirin   . Lipitor [Atorvastatin] Other (See Comments)    myalgia    Family History  Problem Relation Age of Onset  . Heart disease Mother   . Parkinson's disease Father   . Colon cancer Neg Hx   . Esophageal cancer Neg Hx   . Stomach cancer Neg Hx     Social History   Tobacco Use  . Smoking status: Never Smoker  . Smokeless tobacco: Never Used  Substance Use Topics  . Alcohol use: Yes    Comment: rare  . Drug  use: No    ROS: As per history of present illness, otherwise negative  BP 126/82   Pulse (!) 52   Ht 5' 10"  (1.778 m)   Wt 201 lb (91.2 kg)   BMI 28.84 kg/m  Constitutional: Well-developed and well-nourished. No distress. HEENT: Normocephalic and atraumatic.  Conjunctivae are normal.  No scleral icterus. Neck: Neck supple. Trachea midline. Cardiovascular: Normal rate, regular rhythm and intact distal pulses. No M/R/G Pulmonary/chest: Effort normal and breath sounds normal. No wheezing, rales or rhonchi. Abdominal: Soft, nontender, nondistended. Bowel sounds active throughout. There are no masses palpable. No hepatosplenomegaly. Extremities: no clubbing, cyanosis, or edema Neurological: Alert and oriented to person place and time. Skin: Skin is warm and dry.  Psychiatric: Normal mood and affect. Behavior is normal.  RELEVANT LABS AND IMAGING: CBC    Component Value Date/Time   WBC 6.1 02/12/2018 0815   WBC 5.9 12/17/2014 1233   RBC 4.75 02/12/2018 0815   RBC 5.0 12/17/2014 1233   HGB 14.7 02/12/2018 0815   HCT 44.9 02/12/2018 0815   PLT 339 02/12/2018 0815   MCV 95 02/12/2018 0815   MCH 30.9 02/12/2018 0815   MCH 29.3 12/17/2014 1233   MCHC 32.7 02/12/2018 0815   MCHC 31.8 12/17/2014 1233   RDW 13.4 02/12/2018 0815   LYMPHSABS 1.8 02/12/2018 0815   EOSABS 0.2 02/12/2018 0815   BASOSABS 0.1 02/12/2018 0815    CMP     Component Value Date/Time   NA 143 02/12/2018 0815   K 4.5 02/12/2018 0815   CL 105 02/12/2018 0815   CO2 23 02/12/2018 0815   GLUCOSE 84 02/12/2018 0815   GLUCOSE 88 05/16/2013 0859   BUN 13 02/12/2018 0815   CREATININE 0.86 02/12/2018 0815   CREATININE 0.87 05/16/2013 0859   CALCIUM 9.5 02/12/2018 0815   PROT 7.4 02/12/2018 0815   ALBUMIN 4.2 02/12/2018 0815   AST 24 02/12/2018 0815   ALT 37 02/12/2018 0815   ALKPHOS 91 02/12/2018 0815   BILITOT 0.7 02/12/2018 0815   GFRNONAA 96 02/12/2018 0815   GFRNONAA >89 05/16/2013 0859   GFRAA 111  02/12/2018 0815   GFRAA >89 05/16/2013 0859    ASSESSMENT/PLAN: 57 year old male with a past medical history of left-sided segmental colitis, severe diverticulosis, B12 deficiency, hypertension, hyperlipidemia who is seen in consultation at the request of Dr. Laurance Flatten to reestablish care  for his left-sided colitis.   1.  Left-sided segmental colitis (Crohn's colitis versus SCAD) --difficult to know if his left-sided colitis is more secondary to diverticulosis or truly Crohn's disease.  It is long-standing and dates back to 2002 (disease duration 17 years).  He is not having significant colitis symptoms but does still  intermittently with abdominal crampy discomfort.  Very likely constipation and severe diverticulosis are contributing to his symptoms.  He is reluctant to take any medication at present --I recommended that we repeat colonoscopy to evaluate colitis activity.  If this is Crohn's he is due for surveillance given disease activity of 17 years and last colonoscopy in 2012.  We discussed the risk, benefits and alternatives and he is agreeable and wishes to proceed --I am going to check CRP and a celiac panel given his worsening symptoms with wheat products though this is inconsistent.  He does also at times develop a rash related to wheat product.  We discussed how celiac disease can overlap with colitis and some patients.  2.  Constipation/severe sigmoid diverticulosis --likely interrelated problems and likely related to #1 as well.  Trial of Linzess 72 mcg daily  3.  B12 deficiency --history of.  I am going to check B12 today and if low will definitely recommend supplementation.  We discussed how prolonged B12 deficiency can lead to neurologic complications some of which can be hard to reverse.     PK:GYBN[employer], Estella Husk, [place], [place] [id number]

## 2018-05-19 NOTE — Patient Instructions (Signed)
You have been scheduled for a colonoscopy. Please follow written instructions given to you at your visit today.  Please pick up your prep supplies at the pharmacy within the next 1-3 days. If you use inhalers (even only as needed), please bring them with you on the day of your procedure. Your physician has requested that you go to www.startemmi.com and enter the access code given to you at your visit today. This web site gives a general overview about your procedure. However, you should still follow specific instructions given to you by our office regarding your preparation for the procedure.  Your provider has requested that you go to the basement level for lab work before leaving today. Press "B" on the elevator. The lab is located at the first door on the left as you exit the elevator.  We have sent the following medications to your pharmacy for you to pick up at your convenience: Linzess 72 mcg daily  If you are age 41 or older, your body mass index should be between 23-30. Your Body mass index is 28.84 kg/m. If this is out of the aforementioned range listed, please consider follow up with your Primary Care Provider.  If you are age 75 or younger, your body mass index should be between 19-25. Your Body mass index is 28.84 kg/m. If this is out of the aformentioned range listed, please consider follow up with your Primary Care Provider.

## 2018-05-19 NOTE — Telephone Encounter (Signed)
I have spoken to patient and have instructed him to take medication 30 minutes before breakfast on an empty stomach.

## 2018-05-20 LAB — TISSUE TRANSGLUTAMINASE, IGA: (TTG) AB, IGA: 1 U/mL

## 2018-05-25 ENCOUNTER — Other Ambulatory Visit: Payer: Self-pay

## 2018-05-25 DIAGNOSIS — E539 Vitamin B deficiency, unspecified: Secondary | ICD-10-CM

## 2018-05-26 ENCOUNTER — Other Ambulatory Visit: Payer: Self-pay | Admitting: *Deleted

## 2018-05-26 ENCOUNTER — Ambulatory Visit (INDEPENDENT_AMBULATORY_CARE_PROVIDER_SITE_OTHER): Payer: BC Managed Care – PPO | Admitting: *Deleted

## 2018-05-26 DIAGNOSIS — E538 Deficiency of other specified B group vitamins: Secondary | ICD-10-CM | POA: Diagnosis not present

## 2018-05-26 MED ORDER — CYANOCOBALAMIN 1000 MCG/ML IJ SOLN
1000.0000 ug | INTRAMUSCULAR | Status: AC
  Administered 2018-05-26 – 2018-06-16 (×4): 1000 ug via INTRAMUSCULAR

## 2018-05-26 NOTE — Progress Notes (Signed)
Pt given Cyanocobalamin inj Tolerated well

## 2018-06-02 ENCOUNTER — Ambulatory Visit (INDEPENDENT_AMBULATORY_CARE_PROVIDER_SITE_OTHER): Payer: BC Managed Care – PPO | Admitting: *Deleted

## 2018-06-02 DIAGNOSIS — E538 Deficiency of other specified B group vitamins: Secondary | ICD-10-CM | POA: Diagnosis not present

## 2018-06-02 NOTE — Progress Notes (Signed)
Pt given Cyanocobalamin inj Tolerated well

## 2018-06-09 ENCOUNTER — Ambulatory Visit (INDEPENDENT_AMBULATORY_CARE_PROVIDER_SITE_OTHER): Payer: BC Managed Care – PPO | Admitting: *Deleted

## 2018-06-09 DIAGNOSIS — E538 Deficiency of other specified B group vitamins: Secondary | ICD-10-CM

## 2018-06-09 NOTE — Progress Notes (Signed)
Pt given Cyanocobalamin inj Tolerated well

## 2018-06-16 ENCOUNTER — Ambulatory Visit (INDEPENDENT_AMBULATORY_CARE_PROVIDER_SITE_OTHER): Payer: BC Managed Care – PPO | Admitting: *Deleted

## 2018-06-16 DIAGNOSIS — E538 Deficiency of other specified B group vitamins: Secondary | ICD-10-CM

## 2018-06-16 NOTE — Progress Notes (Signed)
Pt given Cyanocobalamin inj Tolerated well

## 2018-07-13 ENCOUNTER — Encounter: Payer: Self-pay | Admitting: Internal Medicine

## 2018-07-18 ENCOUNTER — Ambulatory Visit (INDEPENDENT_AMBULATORY_CARE_PROVIDER_SITE_OTHER): Payer: BC Managed Care – PPO | Admitting: *Deleted

## 2018-07-18 DIAGNOSIS — E538 Deficiency of other specified B group vitamins: Secondary | ICD-10-CM | POA: Diagnosis not present

## 2018-07-18 MED ORDER — CYANOCOBALAMIN 1000 MCG/ML IJ SOLN
1000.0000 ug | INTRAMUSCULAR | Status: AC
  Administered 2018-07-18 – 2019-06-26 (×12): 1000 ug via INTRAMUSCULAR

## 2018-07-18 NOTE — Progress Notes (Signed)
Pt given Cyanocobalamin inj Tolerated well

## 2018-07-27 ENCOUNTER — Encounter: Payer: Self-pay | Admitting: Internal Medicine

## 2018-07-27 ENCOUNTER — Ambulatory Visit (AMBULATORY_SURGERY_CENTER): Payer: BC Managed Care – PPO | Admitting: Internal Medicine

## 2018-07-27 VITALS — BP 154/93 | HR 65 | Temp 98.9°F | Resp 12 | Ht 70.0 in | Wt 201.0 lb

## 2018-07-27 DIAGNOSIS — K529 Noninfective gastroenteritis and colitis, unspecified: Secondary | ICD-10-CM | POA: Diagnosis not present

## 2018-07-27 DIAGNOSIS — K5010 Crohn's disease of large intestine without complications: Secondary | ICD-10-CM

## 2018-07-27 MED ORDER — DICYCLOMINE HCL 20 MG PO TABS: 20.0000 mg | ORAL_TABLET | Freq: Three times a day (TID) | ORAL | 0 refills | Status: DC

## 2018-07-27 MED ORDER — SODIUM CHLORIDE 0.9 % IV SOLN: 500.0000 mL | Freq: Once | INTRAVENOUS | Status: DC

## 2018-07-27 NOTE — Patient Instructions (Signed)
YOU HAD AN ENDOSCOPIC PROCEDURE TODAY AT [facility]:   Refer to the procedure report that was given to you for any specific questions about what was found during the examination.  If the procedure report does not answer your questions, please call your gastroenterologist to clarify.  If you requested that your care partner not be given the details of your procedure findings, then the procedure report has been included in a sealed envelope for you to review at your convenience later.  YOU SHOULD EXPECT: Some feelings of bloating in the abdomen. Passage of more gas than usual.  Walking can help get rid of the air that was put into your GI tract during the procedure and reduce the bloating. If you had a lower endoscopy (such as a colonoscopy or flexible sigmoidoscopy) you may notice spotting of blood in your stool or on the toilet paper. If you underwent a bowel prep for your procedure, you may not have a normal bowel movement for a few days.  Please Note:  You might notice some irritation and congestion in your nose or some drainage.  This is from the oxygen used during your procedure.  There is no need for concern and it should clear up in a day or so.  SYMPTOMS TO REPORT IMMEDIATELY:   Following lower endoscopy (colonoscopy or flexible sigmoidoscopy):  Excessive amounts of blood in the stool  Significant tenderness or worsening of abdominal pains  Swelling of the abdomen that is new, acute  Fever of 100F or higher   Following upper endoscopy (EGD)  Vomiting of blood or coffee ground material  New chest pain or pain under the shoulder blades  Painful or persistently difficult swallowing  New shortness of breath  Fever of 100F or higher  Black, tarry-looking stools  For urgent or emergent issues, a gastroenterologist can be reached at any hour by calling [phone].   DIET:  We do recommend a small meal at first, but then you may proceed to your regular diet.  Drink  plenty of fluids but you should avoid alcoholic beverages for 24 hours.  ACTIVITY:  You should plan to take it easy for the rest of today and you should NOT DRIVE or use heavy machinery until tomorrow (because of the sedation medicines used during the test).    FOLLOW UP: Our staff will call the number listed on your records the next business day following your procedure to check on you and address any questions or concerns that you may have regarding the information given to you following your procedure. If we do not reach you, we will leave a message.  However, if you are feeling well and you are not experiencing any problems, there is no need to return our call.  We will assume that you have returned to your regular daily activities without incident.  If any biopsies were taken you will be contacted by phone or by letter within the next 1-3 weeks.  Please call us at [phone] if you have not heard about the biopsies in 3 weeks.    SIGNATURES/CONFIDENTIALITY: You and/or your care partner have signed paperwork which will be entered into your electronic medical record.  These signatures attest to the fact that that the information above on your After Visit Summary has been reviewed and is understood.  Full responsibility of the confidentiality of this discharge information lies with you and/or your care-partner.  Hemorrhoid information given.  Await biopsy results.

## 2018-07-27 NOTE — Progress Notes (Signed)
Pt. To bathroom to pass air.  States he did pass some air In br.  Abdomen remain distended and he states he is still somewhat uncomfortable( had 0.69m Levsin). Will await  Revisit by Dr. PHilarie Fredricksonbefore discharging.

## 2018-07-27 NOTE — Progress Notes (Signed)
Called to room to assist during endoscopic procedure.  Patient ID and intended procedure confirmed with present staff. Received instructions for my participation in the procedure from the performing physician.  

## 2018-07-27 NOTE — Op Note (Signed)
Brian Estes: Brian Estes Procedure Date: 07/27/2018 7:54 AM MRN: [id number] Endoscopist: Jerene Bears , MD Age: 57 Referring MD:  Date of Birth: 02-10-1961 Gender: Male Account #: [id number] Procedure:                Colonoscopy Indications:              High risk colon cancer surveillance: Inflammatory                            bowel disease versus segmental colitis associated                            with diverticulosis diagnosed in 2002, Last                            colonoscopy: 2012 Medicines:                Monitored Anesthesia Care Procedure:                Pre-Anesthesia Assessment:                           - Prior to the procedure, a History and Physical                            was performed, and patient medications and                            allergies were reviewed. The patient's tolerance of                            previous anesthesia was also reviewed. The risks                            and benefits of the procedure and the sedation                            options and risks were discussed with the patient.                            All questions were answered, and informed consent                            was obtained. Prior Anticoagulants: The patient has                            taken no previous anticoagulant or antiplatelet                            agents. ASA Grade Assessment: II - A patient with                            mild systemic disease. After reviewing the risks  and benefits, the patient was deemed in                            satisfactory condition to undergo the procedure.                           After obtaining informed consent, the colonoscope                            was passed under direct vision. Throughout the                            procedure, the patient's blood pressure, pulse, and                            oxygen saturations were monitored continuously. The                         Colonoscope was introduced through the anus and                            advanced to the cecum, identified by appendiceal                            orifice and ileocecal valve. The quality of the                            bowel preparation was good. The terminal ileum,                            ileocecal valve, appendiceal orifice, and rectum                            were photographed. The colonoscopy was technically                            difficult and complex due to multiple diverticula                            in the sigmoid colon and restricted mobility of the                            sigmoid colon. Successful completion of the                            procedure was aided by water immersion, changing                            the patient to a supine position and withdrawing                            the scope and replacing with the pediatric  colonoscope. The patient tolerated the procedure                            well. Scope In: 8:21:53 AM Scope Out: 8:39:36 AM Scope Withdrawal Time: 0 hours 10 minutes 28 seconds  Total Procedure Duration: 0 hours 17 minutes 43 seconds  Findings:                 The digital rectal exam was normal.                           The terminal ileum appeared normal.                           Segmental moderate inflammation characterized by                            congestion (edema), erythema and granularity was                            found in the sigmoid colon. The inflamed segment is                            relatively short approximately 10 to 15 cm and                            located only in the segment with diverticulosis.                            Multiple biopsies were taken with a cold forceps                            for histology.                           Internal hemorrhoids were found during                            retroflexion. The hemorrhoids were small.                            The exam was otherwise without abnormality. There                            was no evidence of colitis in the cecum, ascending                            colon, transverse colon, descending colon,                            rectosigmoid colon or rectum Complications:            No immediate complications. Estimated Blood Loss:     Estimated blood loss was minimal. Impression:               - The examined portion of the ileum was normal.                           -  Segmental moderate inflammation was found in the                            sigmoid colon, rule out inflammatory bowel disease.                            Biopsied.                           - Internal hemorrhoids.                           - The examination was otherwise normal. Recommendation:           - Patient has a contact number available for                            emergencies. The signs and symptoms of potential                            delayed complications were discussed with the                            patient. Return to normal activities tomorrow.                            Written discharge instructions were provided to the                            patient.                           - Resume previous diet.                           - Continue present medications.                           - Await pathology results.                           - Repeat colonoscopy is recommended. The                            colonoscopy date will be determined after pathology                            results from today's exam become available for                            review. Jerene Bears, MD 07/27/2018 8:49:55 AM This report has been signed electronically.

## 2018-07-27 NOTE — Progress Notes (Signed)
Dr. Hilarie Fredrickson out to recheck pt.  Pt states he always has discomfort after colonoscopy.  Abd. Remains slightly Distended.  Sent rx for Bentyl 40m. Tid per Dr. PVena Rua Order.

## 2018-07-27 NOTE — Progress Notes (Signed)
To recovery, report to RN, VSS. 

## 2018-07-28 ENCOUNTER — Telehealth: Payer: Self-pay

## 2018-07-28 ENCOUNTER — Encounter: Payer: BC Managed Care – PPO | Admitting: Gastroenterology

## 2018-07-28 NOTE — Telephone Encounter (Signed)
  Follow up Call-  Call back number 07/27/2018  Post procedure Call Back phone  # [phone]  Permission to leave phone message Yes  Some recent data might be hidden     Patient questions:  Do you have a fever, pain , or abdominal swelling? No. Pain Score  0 *  Have you tolerated food without any problems? Yes.    Have you been able to return to your normal activities? Yes.    Do you have any questions about your discharge instructions: Diet   No. Medications  No. Follow up visit  No.  Do you have questions or concerns about your Care? No.  Actions: * If pain score is 4 or above: No action needed, pain <4.

## 2018-08-16 ENCOUNTER — Ambulatory Visit (INDEPENDENT_AMBULATORY_CARE_PROVIDER_SITE_OTHER): Payer: BC Managed Care – PPO | Admitting: *Deleted

## 2018-08-16 DIAGNOSIS — E538 Deficiency of other specified B group vitamins: Secondary | ICD-10-CM

## 2018-08-16 DIAGNOSIS — Z23 Encounter for immunization: Secondary | ICD-10-CM | POA: Diagnosis not present

## 2018-08-16 NOTE — Progress Notes (Signed)
Pt given cyanocobalamin inj and flu vaccine Tolerated well

## 2018-08-17 ENCOUNTER — Ambulatory Visit: Payer: BC Managed Care – PPO

## 2018-08-21 ENCOUNTER — Other Ambulatory Visit: Payer: Self-pay | Admitting: Family Medicine

## 2018-09-16 ENCOUNTER — Ambulatory Visit: Payer: BC Managed Care – PPO

## 2018-09-18 ENCOUNTER — Other Ambulatory Visit: Payer: Self-pay | Admitting: Family Medicine

## 2018-09-19 ENCOUNTER — Telehealth: Payer: Self-pay | Admitting: Internal Medicine

## 2018-09-19 ENCOUNTER — Ambulatory Visit (INDEPENDENT_AMBULATORY_CARE_PROVIDER_SITE_OTHER): Payer: BC Managed Care – PPO | Admitting: *Deleted

## 2018-09-19 DIAGNOSIS — E538 Deficiency of other specified B group vitamins: Secondary | ICD-10-CM

## 2018-09-19 NOTE — Telephone Encounter (Signed)
Discussed with pt that per Dr. Vena Rua last OV note the B12 would be indefinite. Pt verbalized understanding.

## 2018-09-19 NOTE — Telephone Encounter (Signed)
Patient wanting to know how long does he keep getting B12 shots.

## 2018-09-19 NOTE — Progress Notes (Signed)
Pt given Cyanocobalamin inj Tolerated well

## 2018-10-18 ENCOUNTER — Ambulatory Visit (INDEPENDENT_AMBULATORY_CARE_PROVIDER_SITE_OTHER): Payer: BC Managed Care – PPO | Admitting: *Deleted

## 2018-10-18 DIAGNOSIS — E538 Deficiency of other specified B group vitamins: Secondary | ICD-10-CM

## 2018-10-18 NOTE — Progress Notes (Signed)
Pt given Cyanocobalamin inj Tolerated well

## 2018-10-19 ENCOUNTER — Ambulatory Visit: Payer: BC Managed Care – PPO

## 2018-11-18 ENCOUNTER — Ambulatory Visit (INDEPENDENT_AMBULATORY_CARE_PROVIDER_SITE_OTHER): Payer: BC Managed Care – PPO | Admitting: *Deleted

## 2018-11-18 DIAGNOSIS — E538 Deficiency of other specified B group vitamins: Secondary | ICD-10-CM

## 2018-11-18 NOTE — Progress Notes (Signed)
Pt given cyanocobalamin inj Tolerated well

## 2018-12-18 ENCOUNTER — Other Ambulatory Visit: Payer: Self-pay | Admitting: Family Medicine

## 2018-12-19 NOTE — Telephone Encounter (Signed)
Last seen 04/29/18

## 2018-12-20 ENCOUNTER — Ambulatory Visit (INDEPENDENT_AMBULATORY_CARE_PROVIDER_SITE_OTHER): Payer: BC Managed Care – PPO | Admitting: *Deleted

## 2018-12-20 DIAGNOSIS — E538 Deficiency of other specified B group vitamins: Secondary | ICD-10-CM | POA: Diagnosis not present

## 2018-12-20 NOTE — Progress Notes (Signed)
Pt given Cyanocobalamin inj Tolerated well

## 2019-01-19 ENCOUNTER — Ambulatory Visit (INDEPENDENT_AMBULATORY_CARE_PROVIDER_SITE_OTHER): Payer: BC Managed Care – PPO | Admitting: *Deleted

## 2019-01-19 ENCOUNTER — Other Ambulatory Visit: Payer: Self-pay

## 2019-01-19 DIAGNOSIS — E538 Deficiency of other specified B group vitamins: Secondary | ICD-10-CM | POA: Diagnosis not present

## 2019-01-19 NOTE — Progress Notes (Signed)
B12 injection given Tolerated well

## 2019-02-07 ENCOUNTER — Telehealth: Payer: Self-pay | Admitting: Family Medicine

## 2019-02-07 NOTE — Telephone Encounter (Signed)
LM for pt - aware to have labs done - same day as appt in April - will call back if he has further questions.

## 2019-02-23 ENCOUNTER — Other Ambulatory Visit: Payer: Self-pay | Admitting: *Deleted

## 2019-02-23 ENCOUNTER — Ambulatory Visit (INDEPENDENT_AMBULATORY_CARE_PROVIDER_SITE_OTHER): Payer: BC Managed Care – PPO | Admitting: Family Medicine

## 2019-02-23 ENCOUNTER — Other Ambulatory Visit: Payer: Self-pay

## 2019-02-23 DIAGNOSIS — I10 Essential (primary) hypertension: Secondary | ICD-10-CM

## 2019-02-23 DIAGNOSIS — K5000 Crohn's disease of small intestine without complications: Secondary | ICD-10-CM

## 2019-02-23 DIAGNOSIS — Z789 Other specified health status: Secondary | ICD-10-CM

## 2019-02-23 DIAGNOSIS — L280 Lichen simplex chronicus: Secondary | ICD-10-CM

## 2019-02-23 DIAGNOSIS — E559 Vitamin D deficiency, unspecified: Secondary | ICD-10-CM

## 2019-02-23 DIAGNOSIS — K5010 Crohn's disease of large intestine without complications: Secondary | ICD-10-CM

## 2019-02-23 DIAGNOSIS — E538 Deficiency of other specified B group vitamins: Secondary | ICD-10-CM

## 2019-02-23 DIAGNOSIS — E7800 Pure hypercholesterolemia, unspecified: Secondary | ICD-10-CM

## 2019-02-23 MED ORDER — ROSUVASTATIN CALCIUM 10 MG PO TABS: 10.0000 mg | ORAL_TABLET | ORAL | 3 refills | Status: DC

## 2019-02-23 NOTE — Patient Instructions (Addendum)
Follow-up with gastroenterology as planned Continue with aggressive therapeutic lifestyle changes including diet and exercise Continue to practice good hand and respiratory hygiene Drink plenty of water and fluids and stay well-hydrated Follow-up with dermatology as planned

## 2019-02-23 NOTE — Progress Notes (Addendum)
Virtual Visit Via telephone Note I connected with@ on 02/23/19 by telephone and verified that I am speaking with the correct person or authorized healthcare agent using two identifiers. Brian Estes is currently located at home and there are no unauthorized people in close proximity. I completed this visit while in a private location in my home .  I connected to the patient by telephone and verified that I am speaking with the correct person.  This visit type was conducted due to national recommendations for restrictions regarding the COVID-19 Pandemic (e.g. social distancing).  This format is felt to be most appropriate for this patient at this time.  All issues noted in this document were discussed and addressed.  No physical exam was performed.    I discussed the limitations, risks, security and privacy concerns of performing an evaluation and management service by telephone and the availability of in person appointments. I also discussed with the patient that there may be a patient responsible charge related to this service. The patient expressed understanding and agreed to proceed.   Date:  02/23/2019    ID:  Brian Estes      1961-06-23        [id number]   Patient Care Team Patient Care Team: Chipper Herb, MD as PCP - General (Family Medicine)  Reason for Visit: Primary Care Follow-up     History of Present Illness & Review of Systems:     QUINDELL Brian Estes is a 58 y.o. year old male primary care patient that presents today for a telehealth visit.  I have been seeing this patient for many years and he has a history of B12 deficiency hyperlipidemia with statin intolerance vitamin D deficiency hypertension and Crohn's disease.  He is followed regularly by the gastroenterologist.  The patient's last colonoscopy was 3 to 4 months ago and he plans to get another one about every 3 years.  Today he denies any chest pain pressure tightness or shortness of breath.  He denies any problems  currently with his stomach and his bowels are moving regularly with no blood in the stool or black tarry bowel movements.  He is passing his water well.  He stays active physically and walks about 3 miles daily.  He is busy currently taking care of both his parents who are elderly and have medical problems of their own.  The patient has had statin intolerance and is currently taking Crestor 10 mg once a week and tolerating it well.  He is going to try to increase this gradually and get up to 10 mg twice weekly.  He plans to come by the office fasting when he gets his B12 shot and will get his blood work done at that time.  He is also been seeing the dermatologist and has been diagnosed with lichen simplex.  He has been placed on Lexapro 10 and Zyrtec 10.  He says this is helped some but seems to be coming back more but not as bad as it was initially.  He will continue to monitor this and follow-up with a dermatologist as needed.  Review of systems as stated otherwise negative for body systems left unmentioned.   The patient does not have symptoms concerning for COVID-19 infection (fever, chills, cough, or new shortness of breath).      Current Medications (Verified) Allergies as of 02/23/2019      Reactions   Aspirin    Lipitor [atorvastatin] Other (See Comments)  myalgia      Medication List       Accurate as of February 23, 2019  7:47 AM. Always use your most recent med list.        dicyclomine 20 MG tablet Commonly known as:  Bentyl Take 1 tablet (20 mg total) by mouth 3 (three) times daily before meals.   finasteride 1 MG tablet Commonly known as:  PROPECIA TAKE 1 TABLET (1 MG TOTAL) BY MOUTH DAILY.   losartan 50 MG tablet Commonly known as:  COZAAR TAKE 1 TABLET (50 MG TOTAL) BY MOUTH DAILY.   mometasone 0.1 % cream Commonly known as:  Elocon Apply 1 application topically daily.   mometasone 0.1 % ointment Commonly known as:  Elocon Apply topically daily.   rosuvastatin 5  MG tablet Commonly known as:  Crestor Take 1 tablet (5 mg total) by mouth once a week.   VITAMIN D PO Take 1,000 Units by mouth.           Allergies (Verified)    Aspirin and Lipitor [atorvastatin]  Past Medical History Past Medical History:  Diagnosis Date  . Colitis   . Diverticulosis of colon (without mention of hemorrhage)   . Hyperlipemia   . Hypertension   . Kidney disease   . Other chronic nonalcoholic liver disease   . Regional enteritis of unspecified site   . Vitamin B12 deficiency   . Vitamin D deficiency      Past Surgical History:  Procedure Laterality Date  . APPENDECTOMY  2001  . INGUINAL HERNIA REPAIR  1976  . KNEE SURGERY  1999   right    Social History   Socioeconomic History  . Marital status: Married    Spouse name: Not on file  . Number of children: 2  . Years of education: Not on file  . Highest education level: Not on file  Occupational History  . Occupation: self employed  Social Needs  . Financial resource strain: Not on file  . Food insecurity:    Worry: Not on file    Inability: Not on file  . Transportation needs:    Medical: Not on file    Non-medical: Not on file  Tobacco Use  . Smoking status: Never Smoker  . Smokeless tobacco: Never Used  Substance and Sexual Activity  . Alcohol use: Yes    Comment: rare  . Drug use: No  . Sexual activity: Not on file  Lifestyle  . Physical activity:    Days per week: Not on file    Minutes per session: Not on file  . Stress: Not on file  Relationships  . Social connections:    Talks on phone: Not on file    Gets together: Not on file    Attends religious service: Not on file    Active member of club or organization: Not on file    Attends meetings of clubs or organizations: Not on file    Relationship status: Not on file  Other Topics Concern  . Not on file  Social History Narrative  . Not on file     Family History  Problem Relation Age of Onset  . Heart disease  Mother   . Parkinson's disease Father   . Colon cancer Neg Hx   . Esophageal cancer Neg Hx   . Stomach cancer Neg Hx       Labs/Other Tests and Data Reviewed:    Wt Readings from Last 3 Encounters:  07/27/18 201  lb (91.2 kg)  05/19/18 201 lb (91.2 kg)  04/29/18 203 lb (92.1 kg)   Temp Readings from Last 3 Encounters:  07/27/18 98.9 F (37.2 C) (Temporal)  04/29/18 98.3 F (36.8 C) (Oral)  02/16/18 (!) 97.5 F (36.4 C) (Oral)   BP Readings from Last 3 Encounters:  07/27/18 (!) 154/93  05/19/18 126/82  04/29/18 133/79   Pulse Readings from Last 3 Encounters:  07/27/18 65  05/19/18 (!) 52  04/29/18 (!) 59     No results found for: HGBA1C Lab Results  Component Value Date   LDLCALC 158 (H) 02/12/2018   CREATININE 0.86 02/12/2018       Chemistry      Component Value Date/Time   NA 143 02/12/2018 0815   K 4.5 02/12/2018 0815   CL 105 02/12/2018 0815   CO2 23 02/12/2018 0815   BUN 13 02/12/2018 0815   CREATININE 0.86 02/12/2018 0815   CREATININE 0.87 05/16/2013 0859      Component Value Date/Time   CALCIUM 9.5 02/12/2018 0815   ALKPHOS 91 02/12/2018 0815   AST 24 02/12/2018 0815   ALT 37 02/12/2018 0815   BILITOT 0.7 02/12/2018 0815         OBSERVATIONS/ OBJECTIVE:     The patient was calm and relating his history well and his biggest complaint was dealing with a lichen simplex which was diagnosed by the dermatologist 3 to 4 months ago and the patient says initially he was better but this seems to come back and is more of a problem again.  He also indicates that he is been watching soaps fabric softeners and detergents that are scented.  He will plan to monitor this for a while longer and will call us back if he continues to have problems with this.  He is tolerating Crestor 10 mg once a week and has plans to try to increase this gradually to maybe 10 mg twice weekly.  He will come by the office and get his B12 injection at the same time we will get blood  work drawn him at that time.  He has no other specific complaints and seemed to be calm and relate his history well to me.  Physical exam deferred due to nature of telephonic visit.  ASSESSMENT & PLAN    Time:   Today, I have spent 25 minutes with the patient via telephone discussing the above including Covid precautions.     Visit Diagnoses: 1. B12 deficiency -Continue with B12 injections as currently doing  2. Pure hypercholesterolemia -Patient is currently taking Crestor 10 mg weekly and plans to try to increase gradually to 20 mg weekly.  3. Statin intolerance -He will continue with aggressive therapeutic lifestyle changes including his walking activity and will make all efforts to increase his Crestor from 10 to 20 mg weekly.  4. Vitamin D deficiency -The patient is currently not taking any vitamin D.  He was told by the gastroenterologist that he would not absorb this well.  We will check this level when he gets his blood work.  5. Essential hypertension -Continue with current treatment  6. 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 Crohn's disease of small intestine without complication ([place]) -Repeat colonoscopy as recommended per Dr. Elmo Putt in about 3 years.  8.  Lichen simplex -Follow-up with Dr. Tarri Glenn as planned  Patient Instructions  Follow-up with gastroenterology as planned Continue with aggressive therapeutic lifestyle  changes including diet and exercise Continue to practice good hand and respiratory hygiene Drink plenty of water and fluids and stay well-hydrated Follow-up with dermatology as planned     The above assessment and management plan was discussed with the patient. The patient verbalized understanding of and has agreed to the management plan. Patient is aware to call the clinic if symptoms persist or worsen. Patient is aware when to return to  the clinic for a follow-up visit. Patient educated on when it is appropriate to go to the emergency department.    Chipper Herb, MD [address], [place], [place] [address] Ph [phone]   Arrie Senate MD

## 2019-03-18 ENCOUNTER — Other Ambulatory Visit: Payer: Self-pay | Admitting: Family Medicine

## 2019-03-23 ENCOUNTER — Ambulatory Visit (INDEPENDENT_AMBULATORY_CARE_PROVIDER_SITE_OTHER): Payer: BC Managed Care – PPO | Admitting: *Deleted

## 2019-03-23 ENCOUNTER — Other Ambulatory Visit: Payer: Self-pay

## 2019-03-23 DIAGNOSIS — E538 Deficiency of other specified B group vitamins: Secondary | ICD-10-CM

## 2019-03-23 NOTE — Progress Notes (Signed)
Pt given Cyanocobalamin inj Tolerated well

## 2019-04-23 ENCOUNTER — Other Ambulatory Visit: Payer: Self-pay | Admitting: Family Medicine

## 2019-04-24 ENCOUNTER — Other Ambulatory Visit: Payer: Self-pay

## 2019-04-24 ENCOUNTER — Ambulatory Visit (INDEPENDENT_AMBULATORY_CARE_PROVIDER_SITE_OTHER): Payer: BC Managed Care – PPO | Admitting: *Deleted

## 2019-04-24 DIAGNOSIS — E538 Deficiency of other specified B group vitamins: Secondary | ICD-10-CM

## 2019-04-24 NOTE — Progress Notes (Signed)
Pt given Cyanocobalamin inj Tolerated well

## 2019-05-08 ENCOUNTER — Other Ambulatory Visit: Payer: Self-pay | Admitting: Family Medicine

## 2019-05-08 DIAGNOSIS — I10 Essential (primary) hypertension: Secondary | ICD-10-CM

## 2019-05-08 MED ORDER — OLMESARTAN MEDOXOMIL 40 MG PO TABS: 40.0000 mg | ORAL_TABLET | Freq: Every day | ORAL | 3 refills | Status: DC

## 2019-05-23 ENCOUNTER — Ambulatory Visit (INDEPENDENT_AMBULATORY_CARE_PROVIDER_SITE_OTHER): Payer: BC Managed Care – PPO | Admitting: *Deleted

## 2019-05-23 DIAGNOSIS — E538 Deficiency of other specified B group vitamins: Secondary | ICD-10-CM | POA: Diagnosis not present

## 2019-05-24 ENCOUNTER — Other Ambulatory Visit: Payer: Self-pay

## 2019-05-24 NOTE — Progress Notes (Signed)
Pt given Cyanocobalamin inj Tolerated well

## 2019-06-26 ENCOUNTER — Other Ambulatory Visit: Payer: Self-pay

## 2019-06-26 ENCOUNTER — Ambulatory Visit (INDEPENDENT_AMBULATORY_CARE_PROVIDER_SITE_OTHER): Payer: BC Managed Care – PPO | Admitting: *Deleted

## 2019-06-26 DIAGNOSIS — E538 Deficiency of other specified B group vitamins: Secondary | ICD-10-CM

## 2019-06-26 NOTE — Progress Notes (Signed)
Pt given Cyanocobalamin inj Tolerated well

## 2019-07-12 ENCOUNTER — Ambulatory Visit (INDEPENDENT_AMBULATORY_CARE_PROVIDER_SITE_OTHER): Payer: BC Managed Care – PPO | Admitting: Family Medicine

## 2019-07-12 ENCOUNTER — Encounter: Payer: Self-pay | Admitting: Family Medicine

## 2019-07-12 DIAGNOSIS — N400 Enlarged prostate without lower urinary tract symptoms: Secondary | ICD-10-CM | POA: Diagnosis not present

## 2019-07-12 DIAGNOSIS — I10 Essential (primary) hypertension: Secondary | ICD-10-CM

## 2019-07-12 DIAGNOSIS — E7800 Pure hypercholesterolemia, unspecified: Secondary | ICD-10-CM

## 2019-07-12 DIAGNOSIS — E538 Deficiency of other specified B group vitamins: Secondary | ICD-10-CM | POA: Diagnosis not present

## 2019-07-12 DIAGNOSIS — L280 Lichen simplex chronicus: Secondary | ICD-10-CM

## 2019-07-12 MED ORDER — FINASTERIDE 1 MG PO TABS: ORAL_TABLET | ORAL | 0 refills | Status: DC

## 2019-07-12 MED ORDER — CETIRIZINE HCL 10 MG PO TABS: 10.0000 mg | ORAL_TABLET | Freq: Every day | ORAL | 1 refills | Status: DC

## 2019-07-12 MED ORDER — ESCITALOPRAM OXALATE 10 MG PO TABS: 10.0000 mg | ORAL_TABLET | Freq: Every day | ORAL | 0 refills | Status: DC

## 2019-07-12 MED ORDER — ROSUVASTATIN CALCIUM 10 MG PO TABS: 10.0000 mg | ORAL_TABLET | ORAL | 0 refills | Status: DC

## 2019-07-12 MED ORDER — OLMESARTAN MEDOXOMIL 40 MG PO TABS: 40.0000 mg | ORAL_TABLET | Freq: Every day | ORAL | 0 refills | Status: DC

## 2019-07-12 NOTE — Progress Notes (Signed)
Virtual Visit via telephone Note Due to COVID-19 pandemic this visit was conducted virtually. This visit type was conducted due to national recommendations for restrictions regarding the COVID-19 Pandemic (e.g. social distancing, sheltering in place) in an effort to limit this patient's exposure and mitigate transmission in our community. All issues noted in this document were discussed and addressed.  A physical exam was not performed with this format.   I connected with Brian Estes on 07/12/19 at 1530 by telephone and verified that I am speaking with the correct person using two identifiers. Brian Estes is currently located at home and no one is currently with them during visit. The provider, Monia Pouch, FNP is located in their office at time of visit.  I discussed the limitations, risks, security and privacy concerns of performing an evaluation and management service by telephone and the availability of in person appointments. I also discussed with the patient that there may be a patient responsible charge related to this service. The patient expressed understanding and agreed to proceed.  Subjective:  Patient ID: Brian Estes, male    DOB: 1961/06/22, 58 y.o.   MRN: [id number]  Chief Complaint:  Medical Management of Chronic Issues   HPI: Brian Estes is a 58 y.o. male presenting on 07/12/2019 for Medical Management of Chronic Issues  1. Essential hypertension Complaint with meds - Yes Current Medications - olmesartan Checking BP at home ranging 129/74 Exercising Regularly - No Watching Salt intake - Yes Pertinent ROS:  Headache - No Fatigue - No Visual Disturbances - No Chest pain - No Dyspnea - No Palpitations - No LE edema - No They report good compliance with medications and can restate their regimen by memory. No medication side effects.  Family, social, and smoking history reviewed.   BP Readings from Last 3 Encounters:  07/27/18 (!) 154/93  05/19/18 126/82   04/29/18 133/79   CMP Latest Ref Rng & Units 02/12/2018 02/09/2017 07/28/2016  Glucose 65 - 99 mg/dL 84 89 84  BUN 6 - 24 mg/dL 13 14 14   Creatinine 0.76 - 1.27 mg/dL 0.86 0.92 0.84  Sodium 134 - 144 mmol/L 143 139 141  Potassium 3.5 - 5.2 mmol/L 4.5 4.4 4.2  Chloride 96 - 106 mmol/L 105 100 101  CO2 20 - 29 mmol/L 23 22 24   Calcium 8.7 - 10.2 mg/dL 9.5 9.1 9.2  Total Protein 6.0 - 8.5 g/dL 7.4 7.3 7.4  Total Bilirubin 0.0 - 1.2 mg/dL 0.7 0.9 0.8  Alkaline Phos 39 - 117 IU/L 91 86 90  AST 0 - 40 IU/L 24 26 24   ALT 0 - 44 IU/L 37 30 36     2. Pure hypercholesterolemia Compliant with medications - Yes Current medications - Crestor Side effects from medications - No Diet - generally healthy Exercise - No  Lab Results  Component Value Date   CHOL 217 (H) 02/12/2018   HDL 39 (L) 02/12/2018   LDLCALC 158 (H) 02/12/2018   TRIG 98 02/12/2018   CHOLHDL 5.6 (H) 02/12/2018     Family and personal medical history reviewed. Smoking and ETOH history reviewed.    3. B12 deficiency On monthly repletion therapy. Doing well.   4. Benign prostatic hyperplasia without lower urinary tract symptoms Symptoms well controlled with medications. Denies current symptoms.   5. Lichen simplex Followed by allergist / dermatologist. Taking Zyrtec, Lexapro, and using mometasone cream for symptom control. Doing will on current regimen.    Relevant past medical,  surgical, family, and social history reviewed and updated as indicated.  Allergies and medications reviewed and updated.   Past Medical History:  Diagnosis Date  . Colitis   . Diverticulosis of colon (without mention of hemorrhage)   . Hyperlipemia   . Hypertension   . Kidney disease   . Other chronic nonalcoholic liver disease   . Regional enteritis of unspecified site   . Vitamin B12 deficiency   . Vitamin D deficiency     Past Surgical History:  Procedure Laterality Date  . APPENDECTOMY  2001  . INGUINAL HERNIA REPAIR  1976  .  KNEE SURGERY  1999   right    Social History   Socioeconomic History  . Marital status: Married    Spouse name: Not on file  . Number of children: 2  . Years of education: Not on file  . Highest education level: Not on file  Occupational History  . Occupation: self employed  Social Needs  . Financial resource strain: Not on file  . Food insecurity    Worry: Not on file    Inability: Not on file  . Transportation needs    Medical: Not on file    Non-medical: Not on file  Tobacco Use  . Smoking status: Never Smoker  . Smokeless tobacco: Never Used  Substance and Sexual Activity  . Alcohol use: Yes    Comment: rare  . Drug use: No  . Sexual activity: Not on file  Lifestyle  . Physical activity    Days per week: Not on file    Minutes per session: Not on file  . Stress: Not on file  Relationships  . Social Herbalist on phone: Not on file    Gets together: Not on file    Attends religious service: Not on file    Active member of club or organization: Not on file    Attends meetings of clubs or organizations: Not on file    Relationship status: Not on file  . Intimate partner violence    Fear of current or ex partner: Not on file    Emotionally abused: Not on file    Physically abused: Not on file    Forced sexual activity: Not on file  Other Topics Concern  . Not on file  Social History Narrative  . Not on file    Outpatient Encounter Medications as of 07/12/2019  Medication Sig  . cetirizine (ZYRTEC) 10 MG tablet Take 1 tablet (10 mg total) by mouth at bedtime.  Marland Kitchen escitalopram (LEXAPRO) 10 MG tablet Take 1 tablet (10 mg total) by mouth daily.  . finasteride (PROPECIA) 1 MG tablet TAKE 1 TABLET BY MOUTH EVERY DAY  . mometasone (ELOCON) 0.1 % cream Apply 1 application topically daily.  Marland Kitchen olmesartan (BENICAR) 40 MG tablet Take 1 tablet (40 mg total) by mouth daily.  Derrill Memo ON 07/13/2019] rosuvastatin (CRESTOR) 10 MG tablet Take 1 tablet (10 mg total) by  mouth 2 (two) times a week.  . [DISCONTINUED] cetirizine (ZYRTEC) 10 MG tablet Take 10 mg by mouth at bedtime.  . [DISCONTINUED] escitalopram (LEXAPRO) 10 MG tablet Take 10 mg by mouth daily.  . [DISCONTINUED] finasteride (PROPECIA) 1 MG tablet TAKE 1 TABLET BY MOUTH EVERY DAY  . [DISCONTINUED] olmesartan (BENICAR) 40 MG tablet Take 1 tablet (40 mg total) by mouth daily for 30 days.  . [DISCONTINUED] rosuvastatin (CRESTOR) 10 MG tablet Take 1 tablet (10 mg total) by mouth 2 (two)  times a week.   No facility-administered encounter medications on file as of 07/12/2019.     Allergies  Allergen Reactions  . Aspirin   . Lipitor [Atorvastatin] Other (See Comments)    myalgia    Review of Systems  Constitutional: Negative for activity change, appetite change, chills, diaphoresis, fatigue, fever and unexpected weight change.  HENT: Negative.   Eyes: Negative.  Negative for photophobia and visual disturbance.  Respiratory: Negative for cough, chest tightness and shortness of breath.   Cardiovascular: Negative for chest pain, palpitations and leg swelling.  Gastrointestinal: Negative for abdominal pain, blood in stool, constipation, diarrhea, nausea, rectal pain and vomiting.  Endocrine: Negative.  Negative for cold intolerance, heat intolerance, polydipsia, polyphagia and polyuria.  Genitourinary: Negative for decreased urine volume, difficulty urinating, discharge, dysuria, enuresis, flank pain, frequency, hematuria, penile pain, penile swelling, scrotal swelling, testicular pain and urgency.  Musculoskeletal: Negative for arthralgias, back pain and myalgias.  Skin: Negative.  Negative for color change, pallor and rash.  Allergic/Immunologic: Negative.   Neurological: Negative for dizziness, tremors, seizures, syncope, facial asymmetry, speech difficulty, weakness, light-headedness, numbness and headaches.  Hematological: Negative.   Psychiatric/Behavioral: Negative for agitation, confusion,  hallucinations, sleep disturbance and suicidal ideas.  All other systems reviewed and are negative.        Observations/Objective: No vital signs or physical exam, this was a telephone or virtual health encounter.  Pt alert and oriented, answers all questions appropriately, and able to speak in full sentences.    Assessment and Plan: Arafat was seen today for medical management of chronic issues.  Diagnoses and all orders for this visit:  Pure hypercholesterolemia Diet encouraged - increase intake of fresh fruits and vegetables, increase intake of lean proteins. Bake, broil, or grill foods. Avoid fried, greasy, and fatty foods. Avoid fast foods. Increase intake of fiber-rich whole grains. Exercise encouraged - at least 150 minutes per week and advance as tolerated.  Goal BMI < 25. Continue medications as prescribed. Follow up in 3-6 months as discussed for labs.  -     rosuvastatin (CRESTOR) 10 MG tablet; Take 1 tablet (10 mg total) by mouth 2 (two) times a week.  Essential hypertension BP well controlled per pt report. Changes were not made in regimen today. Daily blood pressure log given with instructions on how to fill out and told to bring to next visit. Gaol BP 130/80. Pt aware to report any persistent high or low readings. DASH diet and exercise encouraged. Exercise at least 150 minutes per week and increase as tolerated. Goal BMI > 25. Stress management encouraged. Smoking cessation discussed. Avoid excessive alcohol. Avoid NSAID's. Avoid more than 2000 mg of sodium daily. Medications as prescribed. Follow up as scheduled for labs. -     olmesartan (BENICAR) 40 MG tablet; Take 1 tablet (40 mg total) by mouth daily.  B12 deficiency Continue monthly repletion therapy.   Benign prostatic hyperplasia without lower urinary tract symptoms Well controlled, continue below.  -     finasteride (PROPECIA) 1 MG tablet; TAKE 1 TABLET BY MOUTH EVERY DAY  Lichen simplex Well controlled,  continue below.  -     cetirizine (ZYRTEC) 10 MG tablet; Take 1 tablet (10 mg total) by mouth at bedtime. -     escitalopram (LEXAPRO) 10 MG tablet; Take 1 tablet (10 mg total) by mouth daily.     Follow Up Instructions: Return in about 1 month (around 08/11/2019), or if symptoms worsen or fail to improve, for labs, htn, lipids,  BPH.    I discussed the assessment and treatment plan with the patient. The patient was provided an opportunity to ask questions and all were answered. The patient agreed with the plan and demonstrated an understanding of the instructions.   The patient was advised to call back or seek an in-person evaluation if the symptoms worsen or if the condition fails to improve as anticipated.  The above assessment and management plan was discussed with the patient. The patient verbalized understanding of and has agreed to the management plan. Patient is aware to call the clinic if symptoms persist or worsen. Patient is aware when to return to the clinic for a follow-up visit. Patient educated on when it is appropriate to go to the emergency department.    I provided 25 minutes of non-face-to-face time during this encounter. The call started at 1530. The call ended at 1555. The other time was used for coordination of care.    Monia Pouch, FNP-C [facility] [address] [place], [place] [address] [phone] 07/12/19

## 2019-07-13 ENCOUNTER — Ambulatory Visit: Payer: BC Managed Care – PPO | Admitting: Family Medicine

## 2019-07-25 ENCOUNTER — Other Ambulatory Visit: Payer: Self-pay

## 2019-07-25 ENCOUNTER — Ambulatory Visit (INDEPENDENT_AMBULATORY_CARE_PROVIDER_SITE_OTHER): Payer: BC Managed Care – PPO | Admitting: *Deleted

## 2019-07-25 DIAGNOSIS — E538 Deficiency of other specified B group vitamins: Secondary | ICD-10-CM | POA: Diagnosis not present

## 2019-07-25 MED ORDER — CYANOCOBALAMIN 1000 MCG/ML IJ SOLN
1000.0000 ug | INTRAMUSCULAR | Status: AC
  Administered 2019-07-25 – 2020-02-27 (×8): 1000 ug via INTRAMUSCULAR

## 2019-07-25 NOTE — Progress Notes (Signed)
Pt given cyanocobalamin inj Tolerated well

## 2019-08-28 ENCOUNTER — Ambulatory Visit (INDEPENDENT_AMBULATORY_CARE_PROVIDER_SITE_OTHER): Payer: BC Managed Care – PPO

## 2019-08-28 ENCOUNTER — Other Ambulatory Visit: Payer: Self-pay

## 2019-08-28 DIAGNOSIS — Z23 Encounter for immunization: Secondary | ICD-10-CM

## 2019-08-28 DIAGNOSIS — E538 Deficiency of other specified B group vitamins: Secondary | ICD-10-CM | POA: Diagnosis not present

## 2019-08-28 NOTE — Progress Notes (Signed)
Cyanocobalamin injection given to right deltoid.  Patient tolerated well.

## 2019-09-19 ENCOUNTER — Other Ambulatory Visit: Payer: Self-pay

## 2019-09-20 ENCOUNTER — Ambulatory Visit (INDEPENDENT_AMBULATORY_CARE_PROVIDER_SITE_OTHER): Payer: BC Managed Care – PPO | Admitting: Family Medicine

## 2019-09-20 ENCOUNTER — Encounter: Payer: Self-pay | Admitting: Family Medicine

## 2019-09-20 VITALS — BP 116/77 | HR 62 | Temp 98.0°F | Resp 20 | Ht 70.0 in | Wt 205.0 lb

## 2019-09-20 DIAGNOSIS — E538 Deficiency of other specified B group vitamins: Secondary | ICD-10-CM

## 2019-09-20 DIAGNOSIS — E782 Mixed hyperlipidemia: Secondary | ICD-10-CM

## 2019-09-20 DIAGNOSIS — I10 Essential (primary) hypertension: Secondary | ICD-10-CM

## 2019-09-20 DIAGNOSIS — E559 Vitamin D deficiency, unspecified: Secondary | ICD-10-CM

## 2019-09-20 DIAGNOSIS — N400 Enlarged prostate without lower urinary tract symptoms: Secondary | ICD-10-CM

## 2019-09-20 NOTE — Progress Notes (Signed)
Subjective:  Patient ID: Brian Estes, male    DOB: [birth year], 58 y.o.   MRN: [id number]  Patient Care Team: Baruch Gouty, FNP as PCP - General (Family Medicine)   Chief Complaint:  Medical Management of Chronic Issues (2 mo ), Hyperlipidemia, and Hypertension   HPI: Brian Estes is a 58 y.o. male presenting on 09/20/2019 for Medical Management of Chronic Issues (2 mo ), Hyperlipidemia, and Hypertension   1. Essential hypertension Complaint with meds - Yes Current Medications - Benicar Checking BP at home ranging 120/70 Exercising Regularly - Yes, walks at least 3 miles per day Watching Salt intake - Yes Pertinent ROS:  Headache - No Fatigue - No Visual Disturbances - No Chest pain - No Dyspnea - No Palpitations - No LE edema - No They report good compliance with medications and can restate their regimen by memory. No medication side effects.  Family, social, and smoking history reviewed.   BP Readings from Last 3 Encounters:  09/20/19 116/77  07/27/18 (!) 154/93  05/19/18 126/82   CMP Latest Ref Rng & Units 09/20/2019 02/12/2018 02/09/2017  Glucose 65 - 99 mg/dL 85 84 89  BUN 6 - 24 mg/dL _0 Creatinine 0.76 - 1.27 mg/dL 0.88 0.86 0.92  Sodium 134 - 144 mmol/L 139 143 139  Potassium 3.5 - 5.2 mmol/L 4.3 4.5 4.4  Chloride 96 - 106 mmol/L 105 105 100  CO2 20 - 29 mmol/L _1 Calcium 8.7 - 10.2 mg/dL 9.3 9.5 9.1  Total Protein 6.0 - 8.5 g/dL 7.4 7.4 7.3  Total Bilirubin 0.0 - 1.2 mg/dL 0.6 0.7 0.9  Alkaline Phos 39 - 117 IU/L 75 91 86  AST 0 - 40 IU/L _2 ALT 0 - 44 IU/L 46(H) 37 30      2. Vitamin D deficiency Pt is not taking oral repletion therapy. States he was told by his GI provider this would not be beneficial due to his Crohn's Disease. Denies bone pain and tenderness, muscle weakness, fracture, and difficulty walking. Lab Results  Component Value Date   VD25OH 18.0 (L) 09/20/2019   VD25OH 17.9 (L) 02/12/2018   VD25OH 19.0 (L)  02/09/2017   Lab Results  Component Value Date   CALCIUM 9.3 09/20/2019      3. B12 deficiency On monthly IM repletion. Doing great with this therapy. States he feels a big difference when he is compliant with the injections.   4. Mixed hyperlipidemia History of statin intolerance. Does take Crestor twice weekly and tolerates this well. Does exercise on a daily basis and try to watch what he eats.   5. Benign prostatic hyperplasia without lower urinary tract symptoms Is taking finasteride which controls his symptoms well. No nocturia, weak stream, or post void dribbling. No rectal pain or pressure, no scrotal pain or swelling.      Relevant past medical, surgical, family, and social history reviewed and updated as indicated.  Allergies and medications reviewed and updated. Date reviewed: Chart in Epic.   Past Medical History:  Diagnosis Date  . Colitis   . Diverticulosis of colon (without mention of hemorrhage)   . Hyperlipemia   . Hypertension   . Kidney disease   . Other chronic nonalcoholic liver disease   . Regional enteritis of unspecified site   . Vitamin B12 deficiency   . Vitamin D deficiency     Past Surgical History:  Procedure Laterality Date  .  APPENDECTOMY  2001  . INGUINAL HERNIA REPAIR  1976  . KNEE SURGERY  1999   right    Social History   Socioeconomic History  . Marital status: Married    Spouse name: Not on file  . Number of children: 2  . Years of education: Not on file  . Highest education level: Not on file  Occupational History  . Occupation: self employed  Social Needs  . Financial resource strain: Not on file  . Food insecurity    Worry: Not on file    Inability: Not on file  . Transportation needs    Medical: Not on file    Non-medical: Not on file  Tobacco Use  . Smoking status: Never Smoker  . Smokeless tobacco: Never Used  Substance and Sexual Activity  . Alcohol use: Yes    Comment: rare  . Drug use: No  . Sexual  activity: Not on file  Lifestyle  . Physical activity    Days per week: Not on file    Minutes per session: Not on file  . Stress: Not on file  Relationships  . Social Herbalist on phone: Not on file    Gets together: Not on file    Attends religious service: Not on file    Active member of club or organization: Not on file    Attends meetings of clubs or organizations: Not on file    Relationship status: Not on file  . Intimate partner violence    Fear of current or ex partner: Not on file    Emotionally abused: Not on file    Physically abused: Not on file    Forced sexual activity: Not on file  Other Topics Concern  . Not on file  Social History Narrative  . Not on file    Outpatient Encounter Medications as of 09/20/2019  Medication Sig  . cetirizine (ZYRTEC) 10 MG tablet Take 1 tablet (10 mg total) by mouth at bedtime.  Marland Kitchen escitalopram (LEXAPRO) 10 MG tablet Take 1 tablet (10 mg total) by mouth daily.  . finasteride (PROPECIA) 1 MG tablet TAKE 1 TABLET BY MOUTH EVERY DAY  . mometasone (ELOCON) 0.1 % cream Apply 1 application topically daily.  Marland Kitchen olmesartan (BENICAR) 40 MG tablet Take 1 tablet (40 mg total) by mouth daily.  . rosuvastatin (CRESTOR) 10 MG tablet Take 1 tablet (10 mg total) by mouth 2 (two) times a week.   Facility-Administered Encounter Medications as of 09/20/2019  Medication  . cyanocobalamin ((VITAMIN B-12)) injection 1,000 mcg    Allergies  Allergen Reactions  . Aspirin   . Lipitor [Atorvastatin] Other (See Comments)    myalgia    Review of Systems  Constitutional: Negative for activity change, appetite change, chills, diaphoresis, fatigue, fever and unexpected weight change.  HENT: Negative.   Eyes: Negative.  Negative for photophobia and visual disturbance.  Respiratory: Negative for cough, chest tightness and shortness of breath.   Cardiovascular: Negative for chest pain, palpitations and leg swelling.  Gastrointestinal: Negative  for abdominal distention, abdominal pain, anal bleeding, blood in stool, constipation, diarrhea, nausea, rectal pain and vomiting.  Endocrine: Negative.  Negative for cold intolerance, heat intolerance, polydipsia, polyphagia and polyuria.  Genitourinary: Negative for decreased urine volume, difficulty urinating, dysuria, frequency, scrotal swelling, testicular pain and urgency.  Musculoskeletal: Negative for arthralgias and myalgias.  Skin: Negative.  Negative for color change and wound.  Allergic/Immunologic: Negative.   Neurological: Negative for dizziness, tremors, seizures,  syncope, facial asymmetry, speech difficulty, weakness, light-headedness, numbness and headaches.  Hematological: Negative.   Psychiatric/Behavioral: Negative for confusion, hallucinations, sleep disturbance and suicidal ideas.  All other systems reviewed and are negative.       Objective:  BP 116/77   Pulse 62   Temp 98 F (36.7 C)   Resp 20   Ht 5' 10" (1.778 m)   Wt 205 lb (93 kg)   SpO2 96%   BMI 29.41 kg/m    Wt Readings from Last 3 Encounters:  09/20/19 205 lb (93 kg)  07/27/18 201 lb (91.2 kg)  05/19/18 201 lb (91.2 kg)    Physical Exam Vitals signs and nursing note reviewed.  Constitutional:      General: He is not in acute distress.    Appearance: Normal appearance. He is well-developed and well-groomed. He is not ill-appearing, toxic-appearing or diaphoretic.  HENT:     Head: Normocephalic and atraumatic.     Jaw: There is normal jaw occlusion.     Right Ear: Hearing normal.     Left Ear: Hearing normal.     Nose: Nose normal.     Mouth/Throat:     Lips: Pink.     Mouth: Mucous membranes are moist.     Pharynx: Oropharynx is clear. Uvula midline.  Eyes:     General: Lids are normal.     Extraocular Movements: Extraocular movements intact.     Conjunctiva/sclera: Conjunctivae normal.     Pupils: Pupils are equal, round, and reactive to light.  Neck:     Musculoskeletal: Normal  range of motion and neck supple.     Thyroid: No thyroid mass, thyromegaly or thyroid tenderness.     Vascular: No carotid bruit or JVD.     Trachea: Trachea and phonation normal.  Cardiovascular:     Rate and Rhythm: Normal rate and regular rhythm.     Chest Wall: PMI is not displaced.     Pulses: Normal pulses.     Heart sounds: Normal heart sounds. No murmur. No friction rub. No gallop.   Pulmonary:     Effort: Pulmonary effort is normal. No respiratory distress.     Breath sounds: Normal breath sounds. No wheezing.  Abdominal:     General: Bowel sounds are normal. There is no distension or abdominal bruit.     Palpations: Abdomen is soft. There is no hepatomegaly or splenomegaly.     Tenderness: There is no abdominal tenderness. There is no right CVA tenderness or left CVA tenderness.     Hernia: No hernia is present.  Musculoskeletal: Normal range of motion.     Right lower leg: No edema.     Left lower leg: No edema.  Lymphadenopathy:     Cervical: No cervical adenopathy.  Skin:    General: Skin is warm and dry.     Capillary Refill: Capillary refill takes less than 2 seconds.     Coloration: Skin is not cyanotic, jaundiced or pale.     Findings: No rash.  Neurological:     General: No focal deficit present.     Mental Status: He is alert and oriented to person, place, and time.     Cranial Nerves: Cranial nerves are intact. No cranial nerve deficit.     Sensory: Sensation is intact. No sensory deficit.     Motor: Motor function is intact. No weakness.     Coordination: Coordination is intact. Coordination normal.     Gait: Gait is  intact. Gait normal.     Deep Tendon Reflexes: Reflexes are normal and symmetric. Reflexes normal.  Psychiatric:        Attention and Perception: Attention and perception normal.        Mood and Affect: Mood and affect normal.        Speech: Speech normal.        Behavior: Behavior normal. Behavior is cooperative.        Thought Content:  Thought content normal.        Cognition and Memory: Cognition and memory normal.        Judgment: Judgment normal.     Results for orders placed or performed in visit on 05/19/18  CRP High sensitivity  Result Value Ref Range   CRP, High Sensitivity 2.850 0.000 - 5.000 mg/L  Vitamin B12  Result Value Ref Range   Vitamin B-12 137 (L) 211 - 911 pg/mL  IgA  Result Value Ref Range   IgA 448 (H) 68 - 378 mg/dL  Tissue transglutaminase, IgA  Result Value Ref Range   (tTG) Ab, IgA 1 U/mL       Pertinent labs & imaging results that were available during my care of the patient were reviewed by me and considered in my medical decision making.  Assessment & Plan:  Brian Estes was seen today for medical management of chronic issues, hyperlipidemia and hypertension.  Diagnoses and all orders for this visit:  Essential hypertension BP well controlled. Changes were not made in regimen. Goal BP is 130/80. Pt aware to report any persistent high or low readings. DASH diet and exercise encouraged. Exercise at least 150 minutes per week and increase as tolerated. Goal BMI > 25. Stress management encouraged. Avoid nicotine and tobacco product use. Avoid excessive alcohol and NSAID's. Avoid more than 2000 mg of sodium daily. Medications as prescribed. Follow up as scheduled.  -     CMP14+EGFR -     CBC with Differential/Platelet -     Thyroid Panel With TSH  Vitamin D deficiency Labs pending. Eat foods rich in Vit D including milk, orange juice, yogurt with vitamin D added, salmon or mackerel, canned tuna fish, cereals with vitamin D added, and cod liver oil. Get out in the sun but make sure to wear at least SPF 30 sunscreen.  -     Vitamin D 25 hydroxy  B12 deficiency Continue with monthly IM repletion therapy. Labs pending.  -     Vitamin B12  Mixed hyperlipidemia Diet encouraged - increase intake of fresh fruits and vegetables, increase intake of lean proteins. Bake, broil, or grill foods. Avoid  fried, greasy, and fatty foods. Avoid fast foods. Increase intake of fiber-rich whole grains. Exercise encouraged - at least 150 minutes per week and advance as tolerated.  Goal BMI < 25. Continue medications as prescribed. Follow up in 3-6 months as discussed.  -     Lipid panel  Benign prostatic hyperplasia without lower urinary tract symptoms Doing well on current medications. Will check PSA today. Report any new or worsening symptoms.  -     PSA, total and free     Continue all other maintenance medications.  Follow up plan: Return in about 6 months (around 03/19/2020), or if symptoms worsen or fail to improve, for HTN, Vit D.  Continue healthy lifestyle choices, including diet (rich in fruits, vegetables, and lean proteins, and low in salt and simple carbohydrates) and exercise (at least 30 minutes of moderate physical activity daily).  Educational handout given for DASH diet   The above assessment and management plan was discussed with the patient. The patient verbalized understanding of and has agreed to the management plan. Patient is aware to call the clinic if they develop any new symptoms or if symptoms persist or worsen. Patient is aware when to return to the clinic for a follow-up visit. Patient educated on when it is appropriate to go to the emergency department.   Monia Pouch, FNP-C [facility] [phone]

## 2019-09-20 NOTE — Patient Instructions (Signed)
DASH Eating Plan DASH stands for "Dietary Approaches to Stop Hypertension." The DASH eating plan is a healthy eating plan that has been shown to reduce high blood pressure (hypertension). Additional health benefits may include reducing the risk of type 2 diabetes mellitus, heart disease, and stroke. The DASH eating plan may also help with weight loss.  WHAT DO I NEED TO KNOW ABOUT THE DASH EATING PLAN? For the DASH eating plan, you will follow these general guidelines:  Choose foods with a percent daily value for sodium of less than 5% (as listed on the food label).  Use salt-free seasonings or herbs instead of table salt or sea salt.  Check with your health care provider or pharmacist before using salt substitutes.  Eat lower-sodium products, often labeled as "lower sodium" or "no salt added."  Eat fresh foods.  Eat more vegetables, fruits, and low-fat dairy products.  Choose whole grains. Look for the word "whole" as the first word in the ingredient list.  Choose fish and skinless chicken or [place] more often than red meat. Limit fish, poultry, and meat to 6 oz (170 g) each day.  Limit sweets, desserts, sugars, and sugary drinks.  Choose heart-healthy fats.  Limit cheese to 1 oz (28 g) per day.  Eat more home-cooked food and less restaurant, buffet, and fast food.  Limit fried foods.  Cook foods using methods other than frying.  Limit canned vegetables. If you do use them, rinse them well to decrease the sodium.  When eating at a restaurant, ask that your food be prepared with less salt, or no salt if possible.  WHAT FOODS CAN I EAT? Seek help from a dietitian for individual calorie needs.  Grains Whole grain or whole wheat bread. Brown rice. Whole grain or whole wheat pasta. Quinoa, bulgur, and whole grain cereals. Low-sodium cereals. Corn or whole wheat flour tortillas. Whole grain cornbread. Whole grain crackers. Low-sodium crackers.  Vegetables Fresh or frozen  vegetables (raw, steamed, roasted, or grilled). Low-sodium or reduced-sodium tomato and vegetable juices. Low-sodium or reduced-sodium tomato sauce and paste. Low-sodium or reduced-sodium canned vegetables.   Fruits All fresh, canned (in natural juice), or frozen fruits.  Meat and Other Protein Products Ground beef (85% or leaner), grass-fed beef, or beef trimmed of fat. Skinless chicken or [place]. Ground chicken or [place]. Pork trimmed of fat. All fish and seafood. Eggs. Dried beans, peas, or lentils. Unsalted nuts and seeds. Unsalted canned beans.  Dairy Low-fat dairy products, such as skim or 1% milk, 2% or reduced-fat cheeses, low-fat ricotta or cottage cheese, or plain low-fat yogurt. Low-sodium or reduced-sodium cheeses.  Fats and Oils Tub margarines without trans fats. Light or reduced-fat mayonnaise and salad dressings (reduced sodium). Avocado. Safflower, olive, or canola oils. Natural peanut or almond butter.  Other Unsalted popcorn and pretzels. The items listed above may not be a complete list of recommended foods or beverages. Contact your dietitian for more options.  WHAT FOODS ARE NOT RECOMMENDED?  Grains White bread. White pasta. White rice. Refined cornbread. Bagels and croissants. Crackers that contain trans fat.  Vegetables Creamed or fried vegetables. Vegetables in a cheese sauce. Regular canned vegetables. Regular canned tomato sauce and paste. Regular tomato and vegetable juices.  Fruits Dried fruits. Canned fruit in light or heavy syrup. Fruit juice.  Meat and Other Protein Products Fatty cuts of meat. Ribs, chicken wings, bacon, sausage, bologna, salami, chitterlings, fatback, hot dogs, bratwurst, and packaged luncheon meats. Salted nuts and seeds. Canned beans with salt.  Dairy Whole or 2% milk, cream, half-and-half, and cream cheese. Whole-fat or sweetened yogurt. Full-fat cheeses or blue cheese. Nondairy creamers and whipped toppings. Processed cheese,  cheese spreads, or cheese curds.  Condiments Onion and garlic salt, seasoned salt, table salt, and sea salt. Canned and packaged gravies. Worcestershire sauce. Tartar sauce. Barbecue sauce. Teriyaki sauce. Soy sauce, including reduced sodium. Steak sauce. Fish sauce. Oyster sauce. Cocktail sauce. Horseradish. Ketchup and mustard. Meat flavorings and tenderizers. Bouillon cubes. Hot sauce. Tabasco sauce. Marinades. Taco seasonings. Relishes.  Fats and Oils Butter, stick margarine, lard, shortening, ghee, and bacon fat. Coconut, palm kernel, or palm oils. Regular salad dressings.  Other Pickles and olives. Salted popcorn and pretzels.  The items listed above may not be a complete list of foods and beverages to avoid. Contact your dietitian for more information.  WHERE CAN I FIND MORE INFORMATION? National Heart, Lung, and Blood Institute: travelstabloid.com Document Released: 10/15/2011 Document Revised: 03/12/2014 Document Reviewed: 08/30/2013 [facility] Patient Information 2015 [place], [place]. This information is not intended to replace advice given to you by your health care provider. Make sure you discuss any questions you have with your health care provider.   I think that you would greatly benefit from seeing a nutritionist.  If you are interested, please call Dr Jenne Campus at [phone] to schedule an appointment.

## 2019-09-21 LAB — CBC WITH DIFFERENTIAL/PLATELET
Basophils Absolute: 0.1 10*3/uL (ref 0.0–0.2)
Basos: 1 %
EOS (ABSOLUTE): 0.2 10*3/uL (ref 0.0–0.4)
Eos: 2 %
Hematocrit: 43.6 % (ref 37.5–51.0)
Hemoglobin: 14.9 g/dL (ref 13.0–17.7)
Immature Grans (Abs): 0 10*3/uL (ref 0.0–0.1)
Immature Granulocytes: 0 %
Lymphocytes Absolute: 2 10*3/uL (ref 0.7–3.1)
Lymphs: 25 %
MCH: 31.4 pg (ref 26.6–33.0)
MCHC: 34.2 g/dL (ref 31.5–35.7)
MCV: 92 fL (ref 79–97)
Monocytes Absolute: 0.8 10*3/uL (ref 0.1–0.9)
Monocytes: 10 %
Neutrophils Absolute: 4.9 10*3/uL (ref 1.4–7.0)
Neutrophils: 62 %
Platelets: 318 10*3/uL (ref 150–450)
RBC: 4.74 x10E6/uL (ref 4.14–5.80)
RDW: 12.7 % (ref 11.6–15.4)
WBC: 7.9 10*3/uL (ref 3.4–10.8)

## 2019-09-21 LAB — THYROID PANEL WITH TSH
Free Thyroxine Index: 1.7 (ref 1.2–4.9)
T3 Uptake Ratio: 27 % (ref 24–39)
T4, Total: 6.4 ug/dL (ref 4.5–12.0)
TSH: 2.41 u[IU]/mL (ref 0.450–4.500)

## 2019-09-21 LAB — PSA, TOTAL AND FREE
PSA, Free Pct: 30 %
PSA, Free: 0.09 ng/mL
Prostate Specific Ag, Serum: 0.3 ng/mL (ref 0.0–4.0)

## 2019-09-21 LAB — CMP14+EGFR
ALT: 46 IU/L — ABNORMAL HIGH (ref 0–44)
AST: 31 IU/L (ref 0–40)
Albumin/Globulin Ratio: 1.4 (ref 1.2–2.2)
Albumin: 4.3 g/dL (ref 3.8–4.9)
Alkaline Phosphatase: 75 IU/L (ref 39–117)
BUN/Creatinine Ratio: 17 (ref 9–20)
BUN: 15 mg/dL (ref 6–24)
Bilirubin Total: 0.6 mg/dL (ref 0.0–1.2)
CO2: 21 mmol/L (ref 20–29)
Calcium: 9.3 mg/dL (ref 8.7–10.2)
Chloride: 105 mmol/L (ref 96–106)
Creatinine, Ser: 0.88 mg/dL (ref 0.76–1.27)
GFR calc Af Amer: 109 mL/min/{1.73_m2} (ref >59)
GFR calc non Af Amer: 94 mL/min/{1.73_m2} (ref >59)
Globulin, Total: 3.1 g/dL (ref 1.5–4.5)
Glucose: 85 mg/dL (ref 65–99)
Potassium: 4.3 mmol/L (ref 3.5–5.2)
Sodium: 139 mmol/L (ref 134–144)
Total Protein: 7.4 g/dL (ref 6.0–8.5)

## 2019-09-21 LAB — VITAMIN B12: Vitamin B-12: 442 pg/mL (ref 232–1245)

## 2019-09-21 LAB — LIPID PANEL
Chol/HDL Ratio: 6.1 ratio — ABNORMAL HIGH (ref 0.0–5.0)
Cholesterol, Total: 202 mg/dL — ABNORMAL HIGH (ref 100–199)
HDL: 33 mg/dL — ABNORMAL LOW (ref >39)
LDL Chol Calc (NIH): 141 mg/dL — ABNORMAL HIGH (ref 0–99)
Triglycerides: 154 mg/dL — ABNORMAL HIGH (ref 0–149)
VLDL Cholesterol Cal: 28 mg/dL (ref 5–40)

## 2019-09-21 LAB — VITAMIN D 25 HYDROXY (VIT D DEFICIENCY, FRACTURES): Vit D, 25-Hydroxy: 18 ng/mL — ABNORMAL LOW (ref 30.0–100.0)

## 2019-09-25 ENCOUNTER — Ambulatory Visit (INDEPENDENT_AMBULATORY_CARE_PROVIDER_SITE_OTHER): Payer: BC Managed Care – PPO

## 2019-09-25 DIAGNOSIS — E538 Deficiency of other specified B group vitamins: Secondary | ICD-10-CM | POA: Diagnosis not present

## 2019-09-25 NOTE — Progress Notes (Signed)
Cyanocobalamin injection given to left deltoid.  Patient tolerated well.

## 2019-10-23 ENCOUNTER — Other Ambulatory Visit: Payer: Self-pay

## 2019-10-23 ENCOUNTER — Ambulatory Visit (INDEPENDENT_AMBULATORY_CARE_PROVIDER_SITE_OTHER): Payer: BC Managed Care – PPO

## 2019-10-23 DIAGNOSIS — E538 Deficiency of other specified B group vitamins: Secondary | ICD-10-CM | POA: Diagnosis not present

## 2019-10-23 NOTE — Progress Notes (Signed)
Cyanocobalamin injection given to right deltoid.  Patient tolerated well.

## 2019-10-24 ENCOUNTER — Other Ambulatory Visit: Payer: Self-pay | Admitting: Family Medicine

## 2019-10-24 DIAGNOSIS — I10 Essential (primary) hypertension: Secondary | ICD-10-CM

## 2019-10-25 ENCOUNTER — Ambulatory Visit: Payer: BC Managed Care – PPO

## 2019-10-27 ENCOUNTER — Other Ambulatory Visit: Payer: Self-pay | Admitting: Family Medicine

## 2019-10-27 DIAGNOSIS — E7800 Pure hypercholesterolemia, unspecified: Secondary | ICD-10-CM

## 2019-11-28 ENCOUNTER — Other Ambulatory Visit: Payer: Self-pay

## 2019-11-28 ENCOUNTER — Ambulatory Visit (INDEPENDENT_AMBULATORY_CARE_PROVIDER_SITE_OTHER): Payer: BC Managed Care – PPO | Admitting: *Deleted

## 2019-11-28 DIAGNOSIS — E538 Deficiency of other specified B group vitamins: Secondary | ICD-10-CM | POA: Diagnosis not present

## 2019-12-21 ENCOUNTER — Other Ambulatory Visit: Payer: Self-pay | Admitting: Family Medicine

## 2019-12-21 DIAGNOSIS — N400 Enlarged prostate without lower urinary tract symptoms: Secondary | ICD-10-CM

## 2019-12-27 ENCOUNTER — Other Ambulatory Visit: Payer: Self-pay

## 2019-12-27 ENCOUNTER — Ambulatory Visit (INDEPENDENT_AMBULATORY_CARE_PROVIDER_SITE_OTHER): Payer: BC Managed Care – PPO | Admitting: *Deleted

## 2019-12-27 DIAGNOSIS — E538 Deficiency of other specified B group vitamins: Secondary | ICD-10-CM | POA: Diagnosis not present

## 2019-12-27 NOTE — Progress Notes (Signed)
b12 injection given right deltoid, patient tolerated well

## 2020-01-20 ENCOUNTER — Other Ambulatory Visit: Payer: Self-pay | Admitting: Family Medicine

## 2020-01-20 DIAGNOSIS — E7800 Pure hypercholesterolemia, unspecified: Secondary | ICD-10-CM

## 2020-01-29 ENCOUNTER — Other Ambulatory Visit: Payer: Self-pay

## 2020-01-29 ENCOUNTER — Ambulatory Visit (INDEPENDENT_AMBULATORY_CARE_PROVIDER_SITE_OTHER): Payer: BC Managed Care – PPO

## 2020-01-29 DIAGNOSIS — E538 Deficiency of other specified B group vitamins: Secondary | ICD-10-CM

## 2020-01-29 NOTE — Progress Notes (Signed)
Cyanocobalamin injection given to left deltoid.  Patient tolerated well.

## 2020-02-08 ENCOUNTER — Encounter: Payer: Self-pay | Admitting: *Deleted

## 2020-02-27 ENCOUNTER — Other Ambulatory Visit: Payer: Self-pay

## 2020-02-27 ENCOUNTER — Ambulatory Visit (INDEPENDENT_AMBULATORY_CARE_PROVIDER_SITE_OTHER): Payer: BC Managed Care – PPO

## 2020-02-27 DIAGNOSIS — E538 Deficiency of other specified B group vitamins: Secondary | ICD-10-CM | POA: Diagnosis not present

## 2020-02-27 NOTE — Progress Notes (Signed)
Cyanocobalamin injection given to right deltoid.  Patient tolerated well.

## 2020-03-13 ENCOUNTER — Other Ambulatory Visit: Payer: Self-pay | Admitting: *Deleted

## 2020-03-13 DIAGNOSIS — N400 Enlarged prostate without lower urinary tract symptoms: Secondary | ICD-10-CM

## 2020-03-13 MED ORDER — FINASTERIDE 1 MG PO TABS: 1.0000 mg | ORAL_TABLET | Freq: Every day | ORAL | 0 refills | Status: DC

## 2020-03-20 ENCOUNTER — Ambulatory Visit: Payer: BC Managed Care – PPO | Admitting: Family Medicine

## 2020-03-25 ENCOUNTER — Other Ambulatory Visit: Payer: Self-pay

## 2020-03-25 ENCOUNTER — Ambulatory Visit: Payer: BC Managed Care – PPO | Admitting: Family

## 2020-03-25 ENCOUNTER — Encounter: Payer: Self-pay | Admitting: Family

## 2020-03-25 VITALS — BP 114/76 | HR 62 | Temp 98.5°F | Ht 70.0 in | Wt 211.6 lb

## 2020-03-25 DIAGNOSIS — I10 Essential (primary) hypertension: Secondary | ICD-10-CM | POA: Diagnosis not present

## 2020-03-25 DIAGNOSIS — E538 Deficiency of other specified B group vitamins: Secondary | ICD-10-CM

## 2020-03-25 DIAGNOSIS — L280 Lichen simplex chronicus: Secondary | ICD-10-CM

## 2020-03-25 DIAGNOSIS — Z8249 Family history of ischemic heart disease and other diseases of the circulatory system: Secondary | ICD-10-CM

## 2020-03-25 DIAGNOSIS — N400 Enlarged prostate without lower urinary tract symptoms: Secondary | ICD-10-CM

## 2020-03-25 DIAGNOSIS — E782 Mixed hyperlipidemia: Secondary | ICD-10-CM

## 2020-03-25 NOTE — Progress Notes (Signed)
Subjective:    Patient ID: Brian Estes, male    DOB: [birth year], 60 y.o.   MRN: [id number]  Chief Complaint  Patient presents with  . Medical Management of Chronic Issues   PT presents to the office today to establish care. Pt is followed by Dermatologists annually for lichen simplex. He takes Vit B 12 monthly.  Hypertension This is a chronic problem. The current episode started more than 1 year ago. The problem has been resolved since onset. The problem is controlled. Pertinent negatives include no malaise/fatigue, peripheral edema or shortness of breath. Risk factors for coronary artery disease include dyslipidemia, obesity, male gender and sedentary lifestyle. The current treatment provides moderate improvement. There is no history of CAD/MI or CVA.  Hyperlipidemia This is a chronic problem. The current episode started more than 1 year ago. The problem is uncontrolled. Recent lipid tests were reviewed and are high. Pertinent negatives include no shortness of breath. Current antihyperlipidemic treatment includes statins. The current treatment provides moderate improvement of lipids. Risk factors for coronary artery disease include dyslipidemia, male sex and hypertension.  Benign Prostatic Hypertrophy This is a chronic problem. The current episode started more than 1 year ago. Irritative symptoms include nocturia (1).      Review of Systems  Constitutional: Negative for malaise/fatigue.  Respiratory: Negative for shortness of breath.   Genitourinary: Positive for nocturia (1).  All other systems reviewed and are negative.      Objective:   Physical Exam Vitals reviewed.  Constitutional:      General: He is not in acute distress.    Appearance: He is well-developed.  HENT:     Head: Normocephalic.     Right Ear: Tympanic membrane normal.     Left Ear: Tympanic membrane normal.  Eyes:     General:        Right eye: No discharge.        Left eye: No discharge.     Pupils:  Pupils are equal, round, and reactive to light.  Neck:     Thyroid: No thyromegaly.  Cardiovascular:     Rate and Rhythm: Normal rate and regular rhythm.     Heart sounds: Normal heart sounds. No murmur.  Pulmonary:     Effort: Pulmonary effort is normal. No respiratory distress.     Breath sounds: Normal breath sounds. No wheezing.  Abdominal:     General: Bowel sounds are normal. There is no distension.     Palpations: Abdomen is soft.     Tenderness: There is no abdominal tenderness.  Musculoskeletal:        General: No tenderness. Normal range of motion.     Cervical back: Normal range of motion and neck supple.  Skin:    General: Skin is warm and dry.     Findings: No erythema or rash.  Neurological:     Mental Status: He is alert and oriented to person, place, and time.     Cranial Nerves: No cranial nerve deficit.     Deep Tendon Reflexes: Reflexes are normal and symmetric.  Psychiatric:        Behavior: Behavior normal.        Thought Content: Thought content normal.        Judgment: Judgment normal.       BP 114/76   Pulse 62   Temp 98.5 F (36.9 C) (Temporal)   Ht 5' 10"  (1.778 m)   Wt 211 lb 9.6 oz (96 kg)  SpO2 94%   BMI 30.36 kg/m      Assessment & Plan:  Brian Estes comes in today with chief complaint of Medical Management of Chronic Issues   Diagnosis and orders addressed:  1. Essential hypertension - CMP14+EGFR - CBC with Differential/Platelet  2. Mixed hyperlipidemia - CMP14+EGFR - CBC with Differential/Platelet  3. B12 deficiency - CMP14+EGFR - CBC with Differential/Platelet - Vitamin B12  4. Lichen simplex - OXN54+OIOD - CBC with Differential/Platelet  5. Benign prostatic hyperplasia without lower urinary tract symptoms - CMP14+EGFR - CBC with Differential/Platelet  6. Family history of heart disease - CMP14+EGFR - CBC with Differential/Platelet   Labs pending Health Maintenance reviewed Diet and exercise  encouraged  Follow up plan: 6 months    Evelina Dun, FNP

## 2020-03-25 NOTE — Patient Instructions (Signed)
Vitamin B12 Deficiency Vitamin B12 deficiency occurs when the body does not have enough vitamin B12, which is an important vitamin. The body needs this vitamin:  To make red blood cells.  To make DNA. This is the genetic material inside cells.  To help the nerves work properly so they can carry messages from the brain to the body. Vitamin B12 deficiency can cause various health problems, such as a low red blood cell count (anemia) or nerve damage. What are the causes? This condition may be caused by:  Not eating enough foods that contain vitamin B12.  Not having enough stomach acid and digestive fluids to properly absorb vitamin B12 from the food that you eat.  Certain digestive system diseases that make it hard to absorb vitamin B12. These diseases include Crohn's disease, chronic pancreatitis, and cystic fibrosis.  A condition in which the body does not make enough of a protein (intrinsic factor), resulting in too few red blood cells (pernicious anemia).  Having a surgery in which part of the stomach or small intestine is removed.  Taking certain medicines that make it hard for the body to absorb vitamin B12. These medicines include: ? Heartburn medicines (antacids and proton pump inhibitors). ? Certain antibiotic medicines. ? Some medicines that are used to treat diabetes, tuberculosis, gout, or high cholesterol. What increases the risk? The following factors may make you more likely to develop a B12 deficiency:  Being older than age 28.  Eating a vegetarian or vegan diet, especially while you are pregnant.  Eating a poor diet while you are pregnant.  Taking certain medicines.  Having alcoholism. What are the signs or symptoms? In some cases, there are no symptoms of this condition. If the condition leads to anemia or nerve damage, various symptoms can occur, such as:  Weakness.  Fatigue.  Loss of appetite.  Weight loss.  Numbness or tingling in your hands and  feet.  Redness and burning of the tongue.  Confusion or memory problems.  Depression.  Sensory problems, such as color blindness, ringing in the ears, or loss of taste.  Diarrhea or constipation.  Trouble walking. If anemia is severe, symptoms can include:  Shortness of breath.  Dizziness.  Rapid heart rate (tachycardia). How is this diagnosed? This condition may be diagnosed with a blood test to measure the level of vitamin B12 in your blood. You may also have other tests, including:  A group of tests that measure certain characteristics of blood cells (complete blood count, CBC).  A blood test to measure intrinsic factor.  A procedure where a thin tube with a camera on the end is used to look into your stomach or intestines (endoscopy). Other tests may be needed to discover the cause of B12 deficiency. How is this treated? Treatment for this condition depends on the cause. This condition may be treated by:  Changing your eating and drinking habits, such as: ? Eating more foods that contain vitamin B12. ? Drinking less alcohol or no alcohol.  Getting vitamin B12 injections.  Taking vitamin B12 supplements. Your health care provider will tell you which dosage is best for you. Follow these instructions at home: Eating and drinking   Eat lots of healthy foods that contain vitamin B12, including: ? Meats and poultry. This includes beef, pork, chicken, [place], and organ meats, such as liver. ? Seafood. This includes clams, rainbow trout, salmon, tuna, and haddock. ? Eggs. ? Cereal and dairy products that are fortified. This means that vitamin B12  has been added to the food. Check the label on the package to see if the food is fortified. The items listed above may not be a complete list of recommended foods and beverages. Contact a dietitian for more information. General instructions  Get any injections that are prescribed by your health care provider.  Take  supplements only as told by your health care provider. Follow the directions carefully.  Do not drink alcohol if your health care provider tells you not to. In some cases, you may only be asked to limit alcohol use.  Keep all follow-up visits as told by your health care provider. This is important. Contact a health care provider if:  Your symptoms come back. Get help right away if you:  Develop shortness of breath.  Have a rapid heart rate.  Have chest pain.  Become dizzy or lose consciousness. Summary  Vitamin B12 deficiency occurs when the body does not have enough vitamin B12.  The main causes of vitamin B12 deficiency include dietary deficiency, digestive diseases, pernicious anemia, and having a surgery in which part of the stomach or small intestine is removed.  In some cases, there are no symptoms of this condition. If the condition leads to anemia or nerve damage, various symptoms can occur, such as weakness, shortness of breath, and numbness.  Treatment may include getting vitamin B12 injections or taking vitamin B12 supplements. Eat lots of healthy foods that contain vitamin B12. This information is not intended to replace advice given to you by your health care provider. Make sure you discuss any questions you have with your health care provider. Document Revised: 04/14/2019 Document Reviewed: 07/05/2018 Elsevier Patient Education  2020 [employer].

## 2020-03-26 LAB — CBC WITH DIFFERENTIAL/PLATELET
Basophils Absolute: 0.1 10*3/uL (ref 0.0–0.2)
Basos: 1 %
EOS (ABSOLUTE): 0.2 10*3/uL (ref 0.0–0.4)
Eos: 3 %
Hematocrit: 43.1 % (ref 37.5–51.0)
Hemoglobin: 15 g/dL (ref 13.0–17.7)
Immature Grans (Abs): 0 10*3/uL (ref 0.0–0.1)
Immature Granulocytes: 0 %
Lymphocytes Absolute: 2.3 10*3/uL (ref 0.7–3.1)
Lymphs: 33 %
MCH: 31.6 pg (ref 26.6–33.0)
MCHC: 34.8 g/dL (ref 31.5–35.7)
MCV: 91 fL (ref 79–97)
Monocytes Absolute: 0.7 10*3/uL (ref 0.1–0.9)
Monocytes: 10 %
Neutrophils Absolute: 3.7 10*3/uL (ref 1.4–7.0)
Neutrophils: 53 %
Platelets: 328 10*3/uL (ref 150–450)
RBC: 4.75 x10E6/uL (ref 4.14–5.80)
RDW: 12.1 % (ref 11.6–15.4)
WBC: 7 10*3/uL (ref 3.4–10.8)

## 2020-03-26 LAB — CMP14+EGFR
ALT: 50 IU/L — ABNORMAL HIGH (ref 0–44)
AST: 35 IU/L (ref 0–40)
Albumin/Globulin Ratio: 1.4 (ref 1.2–2.2)
Albumin: 4.2 g/dL (ref 3.8–4.9)
Alkaline Phosphatase: 73 IU/L (ref 48–121)
BUN/Creatinine Ratio: 13 (ref 9–20)
BUN: 13 mg/dL (ref 6–24)
Bilirubin Total: 0.8 mg/dL (ref 0.0–1.2)
CO2: 22 mmol/L (ref 20–29)
Calcium: 9.3 mg/dL (ref 8.7–10.2)
Chloride: 102 mmol/L (ref 96–106)
Creatinine, Ser: 0.99 mg/dL (ref 0.76–1.27)
GFR calc Af Amer: 96 mL/min/{1.73_m2} (ref >59)
GFR calc non Af Amer: 83 mL/min/{1.73_m2} (ref >59)
Globulin, Total: 3 g/dL (ref 1.5–4.5)
Glucose: 87 mg/dL (ref 65–99)
Potassium: 4.5 mmol/L (ref 3.5–5.2)
Sodium: 138 mmol/L (ref 134–144)
Total Protein: 7.2 g/dL (ref 6.0–8.5)

## 2020-03-26 LAB — VITAMIN B12: Vitamin B-12: 420 pg/mL (ref 232–1245)

## 2020-04-12 ENCOUNTER — Other Ambulatory Visit: Payer: Self-pay | Admitting: *Deleted

## 2020-04-12 DIAGNOSIS — I10 Essential (primary) hypertension: Secondary | ICD-10-CM

## 2020-04-12 MED ORDER — OLMESARTAN MEDOXOMIL 40 MG PO TABS: 40.0000 mg | ORAL_TABLET | Freq: Every day | ORAL | 1 refills | Status: DC

## 2020-04-15 ENCOUNTER — Other Ambulatory Visit: Payer: Self-pay | Admitting: *Deleted

## 2020-04-15 DIAGNOSIS — E7800 Pure hypercholesterolemia, unspecified: Secondary | ICD-10-CM

## 2020-04-15 MED ORDER — ROSUVASTATIN CALCIUM 10 MG PO TABS: 10.0000 mg | ORAL_TABLET | ORAL | 1 refills | Status: DC

## 2020-06-04 ENCOUNTER — Other Ambulatory Visit: Payer: Self-pay | Admitting: Family

## 2020-06-04 DIAGNOSIS — N400 Enlarged prostate without lower urinary tract symptoms: Secondary | ICD-10-CM

## 2020-08-30 ENCOUNTER — Other Ambulatory Visit: Payer: Self-pay | Admitting: Family

## 2020-08-30 DIAGNOSIS — N400 Enlarged prostate without lower urinary tract symptoms: Secondary | ICD-10-CM

## 2020-09-25 ENCOUNTER — Other Ambulatory Visit: Payer: Self-pay

## 2020-09-25 ENCOUNTER — Encounter: Payer: Self-pay | Admitting: Family

## 2020-09-25 ENCOUNTER — Ambulatory Visit: Payer: BC Managed Care – PPO | Admitting: Family

## 2020-09-25 VITALS — BP 134/84 | HR 61 | Temp 96.8°F | Ht 70.0 in | Wt 209.0 lb

## 2020-09-25 DIAGNOSIS — L280 Lichen simplex chronicus: Secondary | ICD-10-CM

## 2020-09-25 DIAGNOSIS — E538 Deficiency of other specified B group vitamins: Secondary | ICD-10-CM | POA: Diagnosis not present

## 2020-09-25 DIAGNOSIS — I10 Essential (primary) hypertension: Secondary | ICD-10-CM

## 2020-09-25 DIAGNOSIS — E559 Vitamin D deficiency, unspecified: Secondary | ICD-10-CM

## 2020-09-25 DIAGNOSIS — Z23 Encounter for immunization: Secondary | ICD-10-CM | POA: Diagnosis not present

## 2020-09-25 DIAGNOSIS — E782 Mixed hyperlipidemia: Secondary | ICD-10-CM

## 2020-09-25 DIAGNOSIS — K5900 Constipation, unspecified: Secondary | ICD-10-CM

## 2020-09-25 DIAGNOSIS — N400 Enlarged prostate without lower urinary tract symptoms: Secondary | ICD-10-CM

## 2020-09-25 DIAGNOSIS — K760 Fatty (change of) liver, not elsewhere classified: Secondary | ICD-10-CM

## 2020-09-25 MED ORDER — OLMESARTAN MEDOXOMIL 40 MG PO TABS: 40.0000 mg | ORAL_TABLET | Freq: Every day | ORAL | 2 refills | Status: DC

## 2020-09-25 MED ORDER — FINASTERIDE 1 MG PO TABS: 1.0000 mg | ORAL_TABLET | Freq: Every day | ORAL | 2 refills | Status: DC

## 2020-09-25 MED ORDER — CETIRIZINE HCL 10 MG PO TABS: 10.0000 mg | ORAL_TABLET | Freq: Every day | ORAL | 2 refills | Status: DC

## 2020-09-25 MED ORDER — ESCITALOPRAM OXALATE 10 MG PO TABS: 10.0000 mg | ORAL_TABLET | Freq: Every day | ORAL | 2 refills | Status: DC

## 2020-09-25 NOTE — Patient Instructions (Signed)

## 2020-09-25 NOTE — Progress Notes (Signed)
Subjective:    Patient ID: Brian Estes, male    DOB: 23-Jan-1961, 59 y.o.   MRN: [id number]  Chief Complaint  Patient presents with  . Hypertension    no concerns, fasting    PT presents to the office today to establish care. Pt is followed by Dermatologists annually for lichen simplex. He reports he walks 3 miles everyday. He is followed by GI every 3 years for colonoscopy.  Hypertension This is a chronic problem. The current episode started more than 1 year ago. The problem has been resolved since onset. The problem is controlled. Pertinent negatives include no malaise/fatigue, peripheral edema or shortness of breath. Risk factors for coronary artery disease include dyslipidemia, obesity and sedentary lifestyle. The current treatment provides moderate improvement. There is no history of CVA or heart failure.  Benign Prostatic Hypertrophy This is a chronic problem. The current episode started more than 1 year ago. The problem has been waxing and waning since onset. Irritative symptoms include nocturia (1).  Hyperlipidemia This is a chronic problem. The current episode started more than 1 year ago. The problem is uncontrolled. Recent lipid tests were reviewed and are high. Exacerbating diseases include obesity. Pertinent negatives include no shortness of breath. The current treatment provides moderate improvement of lipids.  Constipation This is a chronic problem. The current episode started more than 1 year ago. He has tried diet changes for the symptoms. The treatment provided mild relief.      Review of Systems  Constitutional: Negative for malaise/fatigue.  Respiratory: Negative for shortness of breath.   Gastrointestinal: Positive for constipation.  Genitourinary: Positive for nocturia (1).  All other systems reviewed and are negative.      Objective:   Physical Exam Vitals reviewed.  Constitutional:      General: He is not in acute distress.    Appearance: He is  well-developed.  HENT:     Head: Normocephalic.     Right Ear: Tympanic membrane normal.     Left Ear: Tympanic membrane normal.  Eyes:     General:        Right eye: No discharge.        Left eye: No discharge.     Pupils: Pupils are equal, round, and reactive to light.  Neck:     Thyroid: No thyromegaly.  Cardiovascular:     Rate and Rhythm: Normal rate and regular rhythm.     Heart sounds: Normal heart sounds. No murmur heard.   Pulmonary:     Effort: Pulmonary effort is normal. No respiratory distress.     Breath sounds: Normal breath sounds. No wheezing.  Abdominal:     General: Bowel sounds are normal. There is no distension.     Palpations: Abdomen is soft.     Tenderness: There is no abdominal tenderness.  Musculoskeletal:        General: No tenderness. Normal range of motion.     Cervical back: Normal range of motion and neck supple.  Skin:    General: Skin is warm and dry.     Findings: No erythema or rash.  Neurological:     Mental Status: He is alert and oriented to person, place, and time.     Cranial Nerves: No cranial nerve deficit.     Deep Tendon Reflexes: Reflexes are normal and symmetric.  Psychiatric:        Behavior: Behavior normal.        Thought Content: Thought content normal.  Judgment: Judgment normal.       BP 134/84   Pulse 61   Temp (!) 96.8 F (36 C) (Temporal)   Ht 5' 10"  (1.778 m)   Wt 209 lb (94.8 kg)   SpO2 98%   BMI 29.99 kg/m      Assessment & Plan:  Brian Estes comes in today with chief complaint of Hypertension (no concerns, fasting )   Diagnosis and orders addressed:  1. Benign prostatic hyperplasia without lower urinary tract symptoms - finasteride (PROPECIA) 1 MG tablet; Take 1 tablet (1 mg total) by mouth daily.  Dispense: 90 tablet; Refill: 2  2. Essential hypertension - olmesartan (BENICAR) 40 MG tablet; Take 1 tablet (40 mg total) by mouth daily.  Dispense: 90 tablet; Refill: 2  3. Lichen simplex -  cetirizine (ZYRTEC) 10 MG tablet; Take 1 tablet (10 mg total) by mouth at bedtime.  Dispense: 90 tablet; Refill: 2 - escitalopram (LEXAPRO) 10 MG tablet; Take 1 tablet (10 mg total) by mouth daily.  Dispense: 90 tablet; Refill: 2  4. Need for immunization against influenza - Flu Vaccine QUAD 36+ mos IM  5. B12 deficiency  6. Fatty liver  7. Constipation, unspecified constipation type  8. Mixed hyperlipidemia  9. Vitamin D deficiency   Pt wants to hold off on labs until  Health Maintenance reviewed Diet and exercise encouraged  Follow up plan: 6 months    Evelina Dun, FNP

## 2020-10-05 ENCOUNTER — Other Ambulatory Visit: Payer: Self-pay | Admitting: Family

## 2020-10-05 DIAGNOSIS — E7800 Pure hypercholesterolemia, unspecified: Secondary | ICD-10-CM

## 2020-12-31 ENCOUNTER — Telehealth: Payer: Self-pay | Admitting: Family Medicine

## 2020-12-31 ENCOUNTER — Telehealth: Payer: Self-pay

## 2020-12-31 NOTE — Telephone Encounter (Signed)
Pt was last seen 09/2020. His last B12 injection was 02/2020. Pt states that he stopped coming for injections because he was afraid that he would take something home to his parents after being in the office. States that his dad has passed.  Is it alright for pt to resume B12 injection?

## 2020-12-31 NOTE — Telephone Encounter (Signed)
His B 12 was normal last draw. He can take OTC and no longer needs injection.

## 2020-12-31 NOTE — Telephone Encounter (Signed)
Pt informed that per [facility] he no longer needs to take B12 injections. Recommend he take an OTC B12 supplement. At least 1000 mcg's per day. Pt understood.

## 2020-12-31 NOTE — Telephone Encounter (Signed)
rc for nurse 

## 2021-03-27 ENCOUNTER — Encounter: Payer: Self-pay | Admitting: Family Medicine

## 2021-03-27 ENCOUNTER — Ambulatory Visit (INDEPENDENT_AMBULATORY_CARE_PROVIDER_SITE_OTHER): Payer: BC Managed Care – PPO | Admitting: Family Medicine

## 2021-03-27 DIAGNOSIS — J0100 Acute maxillary sinusitis, unspecified: Secondary | ICD-10-CM

## 2021-03-27 MED ORDER — AMOXICILLIN-POT CLAVULANATE 875-125 MG PO TABS: 1.0000 | ORAL_TABLET | Freq: Two times a day (BID) | ORAL | 0 refills | Status: DC

## 2021-03-27 NOTE — Progress Notes (Signed)
    Subjective:    Patient ID: Brian Estes, male    DOB: 1961-11-02, 60 y.o.   MRN: [id number]   HPI: Brian Estes is a 60 y.o. male presenting for Symptoms include congestion, facial pain, nasal congestion, no cough, post nasal drip and sinus pressure. There is no fever, chills, or sweats. Onset of symptoms was a six days ago, some relief with sudafed.     Depression screen [facility] 2/9 09/25/2020 03/25/2020 09/20/2019 04/29/2018 02/16/2018  Decreased Interest 0 0 0 0 0  Down, Depressed, Hopeless 0 0 0 0 0  PHQ - 2 Score 0 0 0 0 0     Relevant past medical, surgical, family and social history reviewed and updated as indicated.  Interim medical history since our last visit reviewed. Allergies and medications reviewed and updated.  ROS:  Review of Systems   Social History   Tobacco Use  Smoking Status Never Smoker  Smokeless Tobacco Never Used       Objective:     Wt Readings from Last 3 Encounters:  09/25/20 209 lb (94.8 kg)  03/25/20 211 lb 9.6 oz (96 kg)  09/20/19 205 lb (93 kg)     Exam deferred. Pt. Harboring due to COVID 19. Phone visit performed.   Assessment & Plan:   1. Acute maxillary sinusitis, recurrence not specified     Meds ordered this encounter  Medications  . amoxicillin-clavulanate (AUGMENTIN) 875-125 MG tablet    Sig: Take 1 tablet by mouth 2 (two) times daily. Take all of this medication    Dispense:  20 tablet    Refill:  0    No orders of the defined types were placed in this encounter.     Diagnoses and all orders for this visit:  Acute maxillary sinusitis, recurrence not specified  Other orders -     amoxicillin-clavulanate (AUGMENTIN) 875-125 MG tablet; Take 1 tablet by mouth 2 (two) times daily. Take all of this medication    Virtual Visit via telephone Note  I discussed the limitations, risks, security and privacy concerns of performing an evaluation and management service by telephone and the availability of in person  appointments. The patient was identified with two identifiers. Pt.expressed understanding and agreed to proceed. Pt. Is at home. Dr. Livia Snellen is in his office.  Follow Up Instructions:   I discussed the assessment and treatment plan with the patient. The patient was provided an opportunity to ask questions and all were answered. The patient agreed with the plan and demonstrated an understanding of the instructions.   The patient was advised to call back or seek an in-person evaluation if the symptoms worsen or if the condition fails to improve as anticipated.   Total minutes including chart review and phone contact time: 6   Follow up plan: Return if symptoms worsen or fail to improve.  Claretta Fraise, MD [place]

## 2021-06-14 ENCOUNTER — Other Ambulatory Visit: Payer: Self-pay | Admitting: Family

## 2021-06-14 DIAGNOSIS — I10 Essential (primary) hypertension: Secondary | ICD-10-CM

## 2021-06-28 ENCOUNTER — Other Ambulatory Visit: Payer: Self-pay | Admitting: Family

## 2021-06-28 DIAGNOSIS — N400 Enlarged prostate without lower urinary tract symptoms: Secondary | ICD-10-CM

## 2021-08-14 ENCOUNTER — Encounter: Payer: Self-pay | Admitting: Internal Medicine

## 2021-08-25 ENCOUNTER — Encounter: Payer: Self-pay | Admitting: *Deleted

## 2021-09-11 ENCOUNTER — Other Ambulatory Visit: Payer: Self-pay | Admitting: Family

## 2021-09-11 DIAGNOSIS — I10 Essential (primary) hypertension: Secondary | ICD-10-CM

## 2021-09-23 ENCOUNTER — Telehealth: Payer: Self-pay | Admitting: Family

## 2021-09-23 NOTE — Telephone Encounter (Signed)
Pt coming in am, states congestion only, no fever or cough

## 2021-09-24 ENCOUNTER — Ambulatory Visit: Payer: BC Managed Care – PPO | Admitting: Family Medicine

## 2021-09-24 ENCOUNTER — Other Ambulatory Visit: Payer: Self-pay

## 2021-09-24 ENCOUNTER — Encounter: Payer: Self-pay | Admitting: Family Medicine

## 2021-09-24 VITALS — BP 122/81 | HR 98 | Temp 97.7°F | Ht 69.5 in | Wt 210.0 lb

## 2021-09-24 DIAGNOSIS — J00 Acute nasopharyngitis [common cold]: Secondary | ICD-10-CM | POA: Diagnosis not present

## 2021-09-24 DIAGNOSIS — J3489 Other specified disorders of nose and nasal sinuses: Secondary | ICD-10-CM

## 2021-09-24 DIAGNOSIS — R0981 Nasal congestion: Secondary | ICD-10-CM | POA: Diagnosis not present

## 2021-09-24 MED ORDER — FLUTICASONE PROPIONATE 50 MCG/ACT NA SUSP: 2.0000 | Freq: Every day | NASAL | 6 refills | Status: DC

## 2021-09-24 NOTE — Progress Notes (Addendum)
Subjective:  Patient ID: Brian Estes, male    DOB: 17-Sep-1961, 60 y.o.   MRN: [id number]  Patient Care Team: Sharion Balloon, FNP as PCP - General (Family Medicine)   Chief Complaint:  Nasal Congestion   HPI: Brian Estes is a 60 y.o. male presenting on 09/24/2021 for Nasal Congestion   Pt presents today with nasal congestion and rhinorrhea. No other symptomology. States onset 4 days ago. He has not been taking anything at home for the symptoms. No known sick contacts. Has tested negative for COVID and influenza per pt.    Relevant past medical, surgical, family, and social history reviewed and updated as indicated.  Allergies and medications reviewed and updated. Data reviewed: Chart in Epic.   Past Medical History:  Diagnosis Date  . Colitis   . Diverticulosis of colon (without mention of hemorrhage)   . Hyperlipemia   . Hypertension   . Internal hemorrhoids   . Kidney disease   . Other chronic nonalcoholic liver disease   . Regional enteritis of unspecified site   . Vitamin B12 deficiency   . Vitamin D deficiency     Past Surgical History:  Procedure Laterality Date  . APPENDECTOMY  2001  . INGUINAL HERNIA REPAIR  1976  . KNEE SURGERY  1999   right    Social History   Socioeconomic History  . Marital status: Married    Spouse name: Not on file  . Number of children: 2  . Years of education: Not on file  . Highest education level: Not on file  Occupational History  . Occupation: self employed  Tobacco Use  . Smoking status: Never  . Smokeless tobacco: Never  Vaping Use  . Vaping Use: Never used  Substance and Sexual Activity  . Alcohol use: Yes    Comment: rare  . Drug use: No  . Sexual activity: Not on file  Other Topics Concern  . Not on file  Social History Narrative  . Not on file   Social Determinants of Health   Financial Resource Strain: Not on file  Food Insecurity: Not on file  Transportation Needs: Not on file  Physical  Activity: Not on file  Stress: Not on file  Social Connections: Not on file  Intimate Partner Violence: Not on file    Outpatient Encounter Medications as of 09/24/2021  Medication Sig  . amoxicillin-clavulanate (AUGMENTIN) 875-125 MG tablet Take 1 tablet by mouth 2 (two) times daily. Take all of this medication  . fluticasone (FLONASE) 50 MCG/ACT nasal spray Place 2 sprays into both nostrils daily.  Marland Kitchen olmesartan (BENICAR) 40 MG tablet TAKE 1 TABLET BY MOUTH EVERY DAY  . [DISCONTINUED] finasteride (PROPECIA) 1 MG tablet TAKE 1 TABLET BY MOUTH EVERY DAY  . [DISCONTINUED] mometasone (ELOCON) 0.1 % cream Apply 1 application topically daily.  . [DISCONTINUED] cetirizine (ZYRTEC) 10 MG tablet Take 1 tablet (10 mg total) by mouth at bedtime.  . [DISCONTINUED] escitalopram (LEXAPRO) 10 MG tablet Take 1 tablet (10 mg total) by mouth daily.   No facility-administered encounter medications on file as of 09/24/2021.    Allergies  Allergen Reactions  . Aspirin   . Lipitor [Atorvastatin] Other (See Comments)    myalgia    Review of Systems  Constitutional:  Negative for activity change, appetite change, chills, diaphoresis, fatigue, fever and unexpected weight change.  HENT:  Positive for congestion, postnasal drip and rhinorrhea. Negative for dental problem, drooling, ear discharge, ear pain, facial  swelling, hearing loss, mouth sores, nosebleeds, sinus pressure, sinus pain, sneezing, sore throat, tinnitus, trouble swallowing and voice change.   Eyes: Negative.   Respiratory:  Negative for cough, chest tightness and shortness of breath.   Cardiovascular:  Negative for chest pain, palpitations and leg swelling.  Gastrointestinal:  Negative for abdominal pain, blood in stool, constipation, diarrhea, nausea and vomiting.  Endocrine: Negative.   Genitourinary:  Negative for decreased urine volume, difficulty urinating, dysuria, frequency and urgency.  Musculoskeletal:  Negative for arthralgias and  myalgias.  Skin: Negative.   Allergic/Immunologic: Negative.   Neurological:  Negative for dizziness, tremors, seizures, syncope, facial asymmetry, speech difficulty, weakness, light-headedness, numbness and headaches.  Hematological: Negative.   Psychiatric/Behavioral:  Negative for confusion, hallucinations, sleep disturbance and suicidal ideas.   All other systems reviewed and are negative.      Objective:  BP 122/81   Pulse 98   Temp 97.7 F (36.5 C)   Ht 5' 9.5" (1.765 m)   Wt 210 lb (95.3 kg)   SpO2 95%   BMI 30.57 kg/m    Wt Readings from Last 3 Encounters:  09/24/21 210 lb (95.3 kg)  09/25/20 209 lb (94.8 kg)  03/25/20 211 lb 9.6 oz (96 kg)    Physical Exam Vitals and nursing note reviewed.  Constitutional:      Appearance: Normal appearance. He is obese.  HENT:     Head: Normocephalic and atraumatic.     Right Ear: Tympanic membrane, ear canal and external ear normal.     Left Ear: Tympanic membrane, ear canal and external ear normal.     Nose: Congestion and rhinorrhea present. No nasal deformity, septal deviation, signs of injury, laceration, nasal tenderness or mucosal edema. Rhinorrhea is clear.     Right Turbinates: Swollen and pale. Not enlarged.     Left Turbinates: Swollen and pale. Not enlarged.     Right Sinus: No maxillary sinus tenderness or frontal sinus tenderness.     Left Sinus: No maxillary sinus tenderness or frontal sinus tenderness.     Mouth/Throat:     Mouth: Mucous membranes are moist.     Pharynx: Oropharynx is clear.  Eyes:     Conjunctiva/sclera: Conjunctivae normal.     Pupils: Pupils are equal, round, and reactive to light.  Cardiovascular:     Rate and Rhythm: Normal rate and regular rhythm.     Heart sounds: Normal heart sounds. No murmur heard.   No friction rub. No gallop.  Pulmonary:     Effort: Pulmonary effort is normal.     Breath sounds: Normal breath sounds.  Musculoskeletal:     Cervical back: Normal range of motion  and neck supple.  Lymphadenopathy:     Cervical: No cervical adenopathy.  Skin:    General: Skin is warm and dry.     Capillary Refill: Capillary refill takes less than 2 seconds.  Neurological:     General: No focal deficit present.     Mental Status: He is alert and oriented to person, place, and time.  Psychiatric:        Mood and Affect: Mood normal.        Behavior: Behavior normal.        Thought Content: Thought content normal.        Judgment: Judgment normal.    Results for orders placed or performed in visit on 03/25/20  CMP14+EGFR  Result Value Ref Range   Glucose 87 65 - 99 mg/dL  BUN 13 6 - 24 mg/dL   Creatinine, Ser 0.99 0.76 - 1.27 mg/dL   GFR calc non Af Amer 83 >59 mL/min/1.73   GFR calc Af Amer 96 >59 mL/min/1.73   BUN/Creatinine Ratio 13 9 - 20   Sodium 138 134 - 144 mmol/L   Potassium 4.5 3.5 - 5.2 mmol/L   Chloride 102 96 - 106 mmol/L   CO2 22 20 - 29 mmol/L   Calcium 9.3 8.7 - 10.2 mg/dL   Total Protein 7.2 6.0 - 8.5 g/dL   Albumin 4.2 3.8 - 4.9 g/dL   Globulin, Total 3.0 1.5 - 4.5 g/dL   Albumin/Globulin Ratio 1.4 1.2 - 2.2   Bilirubin Total 0.8 0.0 - 1.2 mg/dL   Alkaline Phosphatase 73 48 - 121 IU/L   AST 35 0 - 40 IU/L   ALT 50 (H) 0 - 44 IU/L  CBC with Differential/Platelet  Result Value Ref Range   WBC 7.0 3.4 - 10.8 x10E3/uL   RBC 4.75 4.14 - 5.80 x10E6/uL   Hemoglobin 15.0 13.0 - 17.7 g/dL   Hematocrit 43.1 37.5 - 51.0 %   MCV 91 79 - 97 fL   MCH 31.6 26.6 - 33.0 pg   MCHC 34.8 31.5 - 35.7 g/dL   RDW 12.1 11.6 - 15.4 %   Platelets 328 150 - 450 x10E3/uL   Neutrophils 53 Not Estab. %   Lymphs 33 Not Estab. %   Monocytes 10 Not Estab. %   Eos 3 Not Estab. %   Basos 1 Not Estab. %   Neutrophils Absolute 3.7 1.4 - 7.0 x10E3/uL   Lymphocytes Absolute 2.3 0.7 - 3.1 x10E3/uL   Monocytes Absolute 0.7 0.1 - 0.9 x10E3/uL   EOS (ABSOLUTE) 0.2 0.0 - 0.4 x10E3/uL   Basophils Absolute 0.1 0.0 - 0.2 x10E3/uL   Immature Granulocytes 0 Not  Estab. %   Immature Grans (Abs) 0.0 0.0 - 0.1 x10E3/uL  Vitamin B12  Result Value Ref Range   Vitamin B-12 420 232 - 1,245 pg/mL       Pertinent labs & imaging results that were available during my care of the patient were reviewed by me and considered in my medical decision making.  Assessment & Plan:  Aniket was seen today for nasal congestion.  Diagnoses and all orders for this visit:  Nasal congestion with rhinorrhea Acute rhinitis Negative for COVID and influenza. Nasal congestion with rhinorrhea. No indications of bacterial or viral infection. Likely allergic rhinitis but pt denies triggers. Will burst with steroids in office and initiate Flonase. Pt aware to report any new or worsening symptoms.   -     fluticasone (FLONASE) 50 MCG/ACT nasal spray; Place 2 sprays into both nostrils daily.    Continue all other maintenance medications.  Follow up plan: Return if symptoms worsen or fail to improve.   Continue healthy lifestyle choices, including diet (rich in fruits, vegetables, and lean proteins, and low in salt and simple carbohydrates) and exercise (at least 30 minutes of moderate physical activity daily).  Educational handout given for nonallergic rhinitis  The above assessment and management plan was discussed with the patient. The patient verbalized understanding of and has agreed to the management plan. Patient is aware to call the clinic if they develop any new symptoms or if symptoms persist or worsen. Patient is aware when to return to the clinic for a follow-up visit. Patient educated on when it is appropriate to go to the emergency department.   Monia Pouch,  FNP-C [place] [phone]

## 2021-09-29 ENCOUNTER — Other Ambulatory Visit: Payer: Self-pay

## 2021-09-29 ENCOUNTER — Encounter: Payer: Self-pay | Admitting: Family

## 2021-09-29 ENCOUNTER — Ambulatory Visit (INDEPENDENT_AMBULATORY_CARE_PROVIDER_SITE_OTHER): Payer: BC Managed Care – PPO | Admitting: Family

## 2021-09-29 VITALS — BP 123/74 | HR 64 | Temp 97.5°F | Ht 69.5 in | Wt 207.4 lb

## 2021-09-29 DIAGNOSIS — Z114 Encounter for screening for human immunodeficiency virus [HIV]: Secondary | ICD-10-CM | POA: Diagnosis not present

## 2021-09-29 DIAGNOSIS — Z1159 Encounter for screening for other viral diseases: Secondary | ICD-10-CM

## 2021-09-29 DIAGNOSIS — Z789 Other specified health status: Secondary | ICD-10-CM

## 2021-09-29 DIAGNOSIS — I10 Essential (primary) hypertension: Secondary | ICD-10-CM

## 2021-09-29 DIAGNOSIS — Z0001 Encounter for general adult medical examination with abnormal findings: Secondary | ICD-10-CM

## 2021-09-29 DIAGNOSIS — L280 Lichen simplex chronicus: Secondary | ICD-10-CM

## 2021-09-29 DIAGNOSIS — Z0000 Encounter for general adult medical examination without abnormal findings: Secondary | ICD-10-CM

## 2021-09-29 DIAGNOSIS — E559 Vitamin D deficiency, unspecified: Secondary | ICD-10-CM

## 2021-09-29 DIAGNOSIS — E782 Mixed hyperlipidemia: Secondary | ICD-10-CM

## 2021-09-29 NOTE — Addendum Note (Signed)
Addended by: Ladean Raya on: 09/29/2021 09:52 AM   Modules accepted: Orders

## 2021-09-29 NOTE — Progress Notes (Signed)
Subjective:    Patient ID: Brian Estes, male    DOB: 1961-06-03, 60 y.o.   MRN: [id number]  Chief Complaint  Patient presents with   Medical Management of Chronic Issues   PT presents to the office today to CPE.  Pt is followed by Dermatologists annually for lichen simplex. He reports he walks 3 miles everyday. He is followed by GI every 3 years for colonoscopy.  Hypertension This is a chronic problem. The current episode started more than 1 year ago. The problem has been resolved since onset. The problem is controlled. Associated symptoms include malaise/fatigue. Pertinent negatives include no peripheral edema or shortness of breath. Risk factors for coronary artery disease include obesity. The current treatment provides moderate improvement.  Hyperlipidemia This is a chronic problem. The current episode started more than 1 year ago. Exacerbating diseases include obesity. Pertinent negatives include no shortness of breath. Current antihyperlipidemic treatment includes diet change. The current treatment provides no improvement of lipids. Risk factors for coronary artery disease include dyslipidemia and hypertension.  Constipation This is a chronic problem. The current episode started more than 1 year ago. Risk factors include obesity.     Review of Systems  Constitutional:  Positive for malaise/fatigue.  Respiratory:  Negative for shortness of breath.   Gastrointestinal:  Positive for constipation.  All other systems reviewed and are negative.  Family History  Problem Relation Age of Onset   Heart disease Mother    Parkinson's disease Father    Colon cancer Neg Hx    Esophageal cancer Neg Hx    Stomach cancer Neg Hx    Social History   Socioeconomic History   Marital status: Married    Spouse name: Not on file   Number of children: 2   Years of education: Not on file   Highest education level: Not on file  Occupational History   Occupation: self employed  Tobacco Use    Smoking status: Never   Smokeless tobacco: Never  Vaping Use   Vaping Use: Never used  Substance and Sexual Activity   Alcohol use: Yes    Comment: rare   Drug use: No   Sexual activity: Not on file  Other Topics Concern   Not on file  Social History Narrative   Not on file   Social Determinants of Health   Financial Resource Strain: Not on file  Food Insecurity: Not on file  Transportation Needs: Not on file  Physical Activity: Not on file  Stress: Not on file  Social Connections: Not on file       Objective:   Physical Exam Vitals reviewed.  Constitutional:      General: He is not in acute distress.    Appearance: He is well-developed.  HENT:     Head: Normocephalic.     Right Ear: Tympanic membrane normal.     Left Ear: Tympanic membrane normal.  Eyes:     General:        Right eye: No discharge.        Left eye: No discharge.     Pupils: Pupils are equal, round, and reactive to light.  Neck:     Thyroid: No thyromegaly.  Cardiovascular:     Rate and Rhythm: Normal rate and regular rhythm.     Heart sounds: Normal heart sounds. No murmur heard. Pulmonary:     Effort: Pulmonary effort is normal. No respiratory distress.     Breath sounds: Normal breath sounds. No wheezing.  Abdominal:     General: Bowel sounds are normal. There is no distension.     Palpations: Abdomen is soft.     Tenderness: There is no abdominal tenderness.  Musculoskeletal:        General: No tenderness. Normal range of motion.     Cervical back: Normal range of motion and neck supple.  Skin:    General: Skin is warm and dry.     Findings: No erythema or rash.  Neurological:     Mental Status: He is alert and oriented to person, place, and time.     Cranial Nerves: No cranial nerve deficit.     Deep Tendon Reflexes: Reflexes are normal and symmetric.  Psychiatric:        Behavior: Behavior normal.        Thought Content: Thought content normal.        Judgment: Judgment normal.       BP 123/74   Pulse 64   Temp (!) 97.5 F (36.4 C) (Temporal)   Ht 5' 9.5" (1.765 m)   Wt 207 lb 6.4 oz (94.1 kg)   BMI 30.19 kg/m      Assessment & Plan:  Brian Estes comes in today with chief complaint of Medical Management of Chronic Issues   Diagnosis and orders addressed:  1. Annual physical exam - CMP14+EGFR - CBC with Differential/Platelet - Lipid panel - PSA, total and free - TSH - HIV Antibody (routine testing w rflx) - Hepatitis C antibody - VITAMIN D 25 Hydroxy (Vit-D Deficiency, Fractures)  2. Encounter for screening for HIV - CMP14+EGFR - CBC with Differential/Platelet - HIV Antibody (routine testing w rflx)  3. Need for hepatitis C screening test  - CMP14+EGFR - CBC with Differential/Platelet - Hepatitis C antibody  4. Essential hypertension - CMP14+EGFR - CBC with Differential/Platelet  5. Lichen simplex - CYO82+OJZB - CBC with Differential/Platelet  6. Statin intolerance - CMP14+EGFR - CBC with Differential/Platelet  7. Vitamin D deficiency - CMP14+EGFR - CBC with Differential/Platelet - VITAMIN D 25 Hydroxy (Vit-D Deficiency, Fractures)  8. Mixed hyperlipidemia  - CMP14+EGFR - CBC with Differential/Platelet - Lipid panel   Labs pending Health Maintenance reviewed Diet and exercise encouraged  Follow up plan: 1 year    Brian Dun, FNP

## 2021-09-29 NOTE — Patient Instructions (Signed)

## 2021-09-30 ENCOUNTER — Other Ambulatory Visit: Payer: Self-pay | Admitting: Family

## 2021-09-30 LAB — LIPID PANEL
Chol/HDL Ratio: 5.9 ratio — ABNORMAL HIGH (ref 0.0–5.0)
Cholesterol, Total: 165 mg/dL (ref 100–199)
HDL: 28 mg/dL — ABNORMAL LOW (ref >39)
LDL Chol Calc (NIH): 107 mg/dL — ABNORMAL HIGH (ref 0–99)
Triglycerides: 166 mg/dL — ABNORMAL HIGH (ref 0–149)
VLDL Cholesterol Cal: 30 mg/dL (ref 5–40)

## 2021-09-30 LAB — CBC WITH DIFFERENTIAL/PLATELET
Basophils Absolute: 0.1 10*3/uL (ref 0.0–0.2)
Basos: 1 %
EOS (ABSOLUTE): 0.2 10*3/uL (ref 0.0–0.4)
Eos: 2 %
Hematocrit: 43.9 % (ref 37.5–51.0)
Hemoglobin: 15 g/dL (ref 13.0–17.7)
Immature Grans (Abs): 0 10*3/uL (ref 0.0–0.1)
Immature Granulocytes: 0 %
Lymphocytes Absolute: 2.2 10*3/uL (ref 0.7–3.1)
Lymphs: 23 %
MCH: 31.4 pg (ref 26.6–33.0)
MCHC: 34.2 g/dL (ref 31.5–35.7)
MCV: 92 fL (ref 79–97)
Monocytes Absolute: 0.9 10*3/uL (ref 0.1–0.9)
Monocytes: 10 %
Neutrophils Absolute: 6 10*3/uL (ref 1.4–7.0)
Neutrophils: 64 %
Platelets: 385 10*3/uL (ref 150–450)
RBC: 4.78 x10E6/uL (ref 4.14–5.80)
RDW: 11.9 % (ref 11.6–15.4)
WBC: 9.3 10*3/uL (ref 3.4–10.8)

## 2021-09-30 LAB — CMP14+EGFR
ALT: 69 IU/L — ABNORMAL HIGH (ref 0–44)
AST: 44 IU/L — ABNORMAL HIGH (ref 0–40)
Albumin/Globulin Ratio: 1.3 (ref 1.2–2.2)
Albumin: 4.2 g/dL (ref 3.8–4.8)
Alkaline Phosphatase: 92 IU/L (ref 44–121)
BUN/Creatinine Ratio: 13 (ref 10–24)
BUN: 12 mg/dL (ref 8–27)
Bilirubin Total: 0.4 mg/dL (ref 0.0–1.2)
CO2: 24 mmol/L (ref 20–29)
Calcium: 9.3 mg/dL (ref 8.6–10.2)
Chloride: 103 mmol/L (ref 96–106)
Creatinine, Ser: 0.93 mg/dL (ref 0.76–1.27)
Globulin, Total: 3.3 g/dL (ref 1.5–4.5)
Glucose: 87 mg/dL (ref 70–99)
Potassium: 4.2 mmol/L (ref 3.5–5.2)
Sodium: 139 mmol/L (ref 134–144)
Total Protein: 7.5 g/dL (ref 6.0–8.5)
eGFR: 93 mL/min/{1.73_m2} (ref >59)

## 2021-09-30 LAB — PSA, TOTAL AND FREE
PSA, Free Pct: 26.7 %
PSA, Free: 0.16 ng/mL
Prostate Specific Ag, Serum: 0.6 ng/mL (ref 0.0–4.0)

## 2021-09-30 LAB — HEPATITIS C ANTIBODY: Hep C Virus Ab: <0.1 s/co ratio (ref 0.0–0.9)

## 2021-09-30 LAB — TSH: TSH: 1.88 u[IU]/mL (ref 0.450–4.500)

## 2021-09-30 LAB — VITAMIN D 25 HYDROXY (VIT D DEFICIENCY, FRACTURES): Vit D, 25-Hydroxy: 18.6 ng/mL — ABNORMAL LOW (ref 30.0–100.0)

## 2021-09-30 MED ORDER — VITAMIN D (ERGOCALCIFEROL) 1.25 MG (50000 UNIT) PO CAPS: 50000.0000 [IU] | ORAL_CAPSULE | ORAL | 3 refills | Status: DC

## 2021-10-15 ENCOUNTER — Ambulatory Visit: Payer: BC Managed Care – PPO | Admitting: Internal Medicine

## 2021-10-15 ENCOUNTER — Encounter: Payer: Self-pay | Admitting: Internal Medicine

## 2021-10-15 ENCOUNTER — Other Ambulatory Visit: Payer: Self-pay | Admitting: Internal Medicine

## 2021-10-15 ENCOUNTER — Other Ambulatory Visit (INDEPENDENT_AMBULATORY_CARE_PROVIDER_SITE_OTHER): Payer: BC Managed Care – PPO

## 2021-10-15 VITALS — BP 128/80 | HR 76 | Ht 69.5 in | Wt 208.0 lb

## 2021-10-15 DIAGNOSIS — R1011 Right upper quadrant pain: Secondary | ICD-10-CM | POA: Diagnosis not present

## 2021-10-15 DIAGNOSIS — K5900 Constipation, unspecified: Secondary | ICD-10-CM

## 2021-10-15 DIAGNOSIS — K760 Fatty (change of) liver, not elsewhere classified: Secondary | ICD-10-CM

## 2021-10-15 DIAGNOSIS — K5010 Crohn's disease of large intestine without complications: Secondary | ICD-10-CM | POA: Diagnosis not present

## 2021-10-15 DIAGNOSIS — R7989 Other specified abnormal findings of blood chemistry: Secondary | ICD-10-CM | POA: Diagnosis not present

## 2021-10-15 LAB — PROTIME-INR
INR: 1.1 ratio — ABNORMAL HIGH (ref 0.8–1.0)
Prothrombin Time: 12.1 s (ref 9.6–13.1)

## 2021-10-15 LAB — COMPREHENSIVE METABOLIC PANEL
ALT: 43 U/L (ref 0–53)
AST: 26 U/L (ref 0–37)
Albumin: 4 g/dL (ref 3.5–5.2)
Alkaline Phosphatase: 77 U/L (ref 39–117)
BUN: 15 mg/dL (ref 6–23)
CO2: 26 mEq/L (ref 19–32)
Calcium: 9.7 mg/dL (ref 8.4–10.5)
Chloride: 104 mEq/L (ref 96–112)
Creatinine, Ser: 0.82 mg/dL (ref 0.40–1.50)
GFR: 94.98 mL/min (ref >60.00)
Glucose, Bld: 93 mg/dL (ref 70–99)
Potassium: 3.9 mEq/L (ref 3.5–5.1)
Sodium: 138 mEq/L (ref 135–145)
Total Bilirubin: 0.8 mg/dL (ref 0.2–1.2)
Total Protein: 7.9 g/dL (ref 6.0–8.3)

## 2021-10-15 LAB — CBC WITH DIFFERENTIAL/PLATELET
Basophils Absolute: 0.1 10*3/uL (ref 0.0–0.1)
Basophils Relative: 0.8 % (ref 0.0–3.0)
Eosinophils Absolute: 0.2 10*3/uL (ref 0.0–0.7)
Eosinophils Relative: 2 % (ref 0.0–5.0)
HCT: 42.4 % (ref 39.0–52.0)
Hemoglobin: 14.6 g/dL (ref 13.0–17.0)
Lymphocytes Relative: 20.3 % (ref 12.0–46.0)
Lymphs Abs: 1.6 10*3/uL (ref 0.7–4.0)
MCHC: 34.4 g/dL (ref 30.0–36.0)
MCV: 91.2 fl (ref 78.0–100.0)
Monocytes Absolute: 1.1 10*3/uL — ABNORMAL HIGH (ref 0.1–1.0)
Monocytes Relative: 13.7 % — ABNORMAL HIGH (ref 3.0–12.0)
Neutro Abs: 5 10*3/uL (ref 1.4–7.7)
Neutrophils Relative %: 63.2 % (ref 43.0–77.0)
Platelets: 319 10*3/uL (ref 150.0–400.0)
RBC: 4.65 Mil/uL (ref 4.22–5.81)
RDW: 13 % (ref 11.5–15.5)
WBC: 7.9 10*3/uL (ref 4.0–10.5)

## 2021-10-15 MED ORDER — LINACLOTIDE 72 MCG PO CAPS: 72.0000 ug | ORAL_CAPSULE | Freq: Every day | ORAL | 2 refills | Status: DC

## 2021-10-15 NOTE — Progress Notes (Signed)
Patient ID: Brian Estes, male   DOB: 07/14/1961, 60 y.o.   MRN: [id number] HPI: Brian Estes is a 60 year old male with a history of segmental colitis, elevated liver enzymes/fatty liver, hypertension, hyperlipidemia, diverticulosis who is here to discuss constipation as well as recently elevated liver enzymes.  He is here alone today.  He was last here in the office in July 2019 and this led to colonoscopy in September 2019. Colonoscopy on 07/27/2018 revealed segmental moderate inflammation over a 10 to 15 cm segment of sigmoid colon.  This was in an area of dense diverticulosis.  Remainder of the exam including of the terminal ileum were normal.  Biopsies at that time showed chronic active colitis.  No granulomas.  He reports that of late he has had issues with constipation and infrequent and hard stool.  He sees mucus in his stool and intermittent red blood.  He does not have any lower or left-sided abdominal pain.  He had an old prescription for Linzess which is now 61 years old and he started this back over the last 15 days and reports significant improvement in his constipation.  He is asking for refill of this medication.  Separate from this he does feel intermittent right upper quadrant sharp pain.  He wonders if this is related to eating or indigestion.  It is under the right costal margin and typically does not last any longer than a few minutes at a time.  Does not think it is movement related or related to bowel movement.  He will occasionally have vomiting and certain foods "just do not sit well".  He has taken himself off of several medications and also several under direction of his medical providers.  He was treated for lichen simplex with Claritin and an antidepressant.  He has weaned off of this and his rash is no longer been a problem.  He does not drink alcohol.  He remains very active walking multiple miles per day. No known family history of cirrhosis though his father had  pancreatitis.  His mother is now in an assisted living type facility.  Past Medical History:  Diagnosis Date   Colitis    Diverticulosis of colon (without mention of hemorrhage)    Hyperlipemia    Hypertension    Internal hemorrhoids    Kidney disease    Other chronic nonalcoholic liver disease    Regional enteritis of unspecified site    Vitamin B12 deficiency    Vitamin D deficiency     Past Surgical History:  Procedure Laterality Date   APPENDECTOMY  2001   [facility]   right    Outpatient Medications Prior to Visit  Medication Sig Dispense Refill   olmesartan (BENICAR) 40 MG tablet TAKE 1 TABLET BY MOUTH EVERY DAY 90 tablet 0   Vitamin D, Ergocalciferol, (DRISDOL) 1.25 MG (50000 UNIT) CAPS capsule Take 1 capsule (50,000 Units total) by mouth every 7 (seven) days. 12 capsule 3   No facility-administered medications prior to visit.    Allergies  Allergen Reactions   Aspirin    Lipitor [Atorvastatin] Other (See Comments)    myalgia    Family History  Problem Relation Age of Onset   Heart disease Mother    Parkinson's disease Father    Colon cancer Neg Hx    Esophageal cancer Neg Hx    Stomach cancer Neg Hx     Social History   Tobacco Use  Smoking status: Never   Smokeless tobacco: Never  Vaping Use   Vaping Use: Never used  Substance Use Topics   Alcohol use: Yes    Comment: rare   Drug use: No    ROS: As per history of present illness, otherwise negative  BP 128/80   Pulse 76   Ht 5' 9.5" (1.765 m)   Wt 208 lb (94.3 kg)   SpO2 96%   BMI 30.28 kg/m  Gen: awake, alert, NAD HEENT: anicteric  CV: RRR, no mrg Pulm: CTA b/l Abd: soft, NT/ND, +BS throughout Ext: no c/c/e Neuro: nonfocal   RELEVANT LABS AND IMAGING: CBC    Component Value Date/Time   WBC 9.3 09/29/2021 0948   WBC 5.9 12/17/2014 1233   RBC 4.78 09/29/2021 0948   RBC 5.0 12/17/2014 1233   HGB 15.0 09/29/2021 0948   HCT 43.9  09/29/2021 0948   PLT 385 09/29/2021 0948   MCV 92 09/29/2021 0948   MCH 31.4 09/29/2021 0948   MCH 29.3 12/17/2014 1233   MCHC 34.2 09/29/2021 0948   MCHC 31.8 12/17/2014 1233   RDW 11.9 09/29/2021 0948   LYMPHSABS 2.2 09/29/2021 0948   EOSABS 0.2 09/29/2021 0948   BASOSABS 0.1 09/29/2021 0948    CMP     Component Value Date/Time   NA 139 09/29/2021 0948   K 4.2 09/29/2021 0948   CL 103 09/29/2021 0948   CO2 24 09/29/2021 0948   GLUCOSE 87 09/29/2021 0948   GLUCOSE 88 05/16/2013 0859   BUN 12 09/29/2021 0948   CREATININE 0.93 09/29/2021 0948   CREATININE 0.87 05/16/2013 0859   CALCIUM 9.3 09/29/2021 0948   PROT 7.5 09/29/2021 0948   ALBUMIN 4.2 09/29/2021 0948   AST 44 (H) 09/29/2021 0948   ALT 69 (H) 09/29/2021 0948   ALKPHOS 92 09/29/2021 0948   BILITOT 0.4 09/29/2021 0948   GFRNONAA 83 03/25/2020 0847   GFRNONAA >89 05/16/2013 0859   GFRAA 96 03/25/2020 0847   GFRAA >89 05/16/2013 0859    ASSESSMENT/PLAN: 60 year old male with a history of segmental colitis, elevated liver enzymes/fatty liver, hypertension, hyperlipidemia, diverticulosis who is here to discuss constipation as well as recently elevated liver enzymes.    Constipation/segmental colitis --the mucus in his stools along with the constipation and intermittent red blood are very likely related to segmental colitis.  This is longstanding and dates back at least to 2002.  This is a short segment which is either Crohn's or segmental colitis associated with diverticulosis.  I favor SCAD.  He has been intolerant of 5-ASA's in the past.  We discussed this at length. --Continue Linzess 72 mcg daily --Consider repeat colonoscopy at follow-up --We did discuss possible surgical resection of the sigmoid which would likely improve all of the symptoms but we will likely reevaluate with colonoscopy after follow-up appointment and then discuss further  2.  Elevated liver enzymes/fatty liver/right upper quadrant pain --we  discussed fatty liver at length today.  He does have an elevation in AST and ALT though bilirubin and alkaline phosphatase are normal.  I recommended the following additional evaluation --CBC, CMP, INR, ANA, IgG, hepatitis C antibody, TTG, smooth muscle and antimitochondrial antibody --Complete abdominal ultrasound, evaluate gallbladder; ultrasound with elastography to evaluate all NASH and determine if any significant liver fibrosis --If this is only all NASH it will be important for him to maintain his exercise, but also tightly controlled risk factors including blood pressure, blood sugar and cholesterol  YX:AJLUN, Theador Hawthorne, [place],  [place] [address]

## 2021-10-15 NOTE — Patient Instructions (Signed)
Your provider has requested that you go to the basement level for lab work before leaving today. Press "B" on the elevator. The lab is located at the first door on the left as you exit the elevator.  We have sent the following medications to your pharmacy for you to pick up at your convenience: Linzess 71mg- Take 1 capsule by mouth once daily.   [facility] on 10/27/21 at 9:00am. Please arrive 15 minutes prior to your appointment for registration. Make certain not to have anything to eat or drink 6 hours prior to your appointment. Should you need to reschedule your appointment, please contact radiology at [phone] This test typically takes about 30 minutes to perform.  Follow up in 6 months. Office to call with an appointment.    If you are age 2479or younger, your body mass index should be between 19-25. Your Body mass index is 30.28 kg/m. If this is out of the aformentioned range listed, please consider follow up with your Primary Care Provider.   ________________________________________________________  The [facility] providers would like to encourage you to use [facility] with providers for non-urgent requests or questions.  Due to long hold times on the telephone, sending your provider a message by [facility] be a faster and more efficient way to get a response.  Please allow 48 business hours for a response.  Please remember that this is for non-urgent requests.   Thank you for choosing [facility] and [facility].  Dr.Pyrtle

## 2021-10-20 LAB — HEPATITIS C ANTIBODY
Hepatitis C Ab: NONREACTIVE
SIGNAL TO CUT-OFF: 0.05 (ref <1.00)

## 2021-10-20 LAB — ANA: Anti Nuclear Antibody (ANA): NEGATIVE

## 2021-10-20 LAB — ANTI-SMOOTH MUSCLE ANTIBODY, IGG: Actin (Smooth Muscle) Antibody (IGG): 36 U — ABNORMAL HIGH (ref <20)

## 2021-10-20 LAB — MITOCHONDRIAL ANTIBODIES: Mitochondrial M2 Ab, IgG: <=20 U (ref <=20.0)

## 2021-10-20 LAB — IGG: IgG (Immunoglobin G), Serum: 1722 mg/dL — ABNORMAL HIGH (ref 600–1540)

## 2021-10-20 LAB — TISSUE TRANSGLUTAMINASE, IGA: (tTG) Ab, IgA: <1 U/mL

## 2021-10-21 ENCOUNTER — Telehealth: Payer: Self-pay | Admitting: Internal Medicine

## 2021-10-21 NOTE — Telephone Encounter (Signed)
Pt calling for lab results. Please advise. 

## 2021-10-21 NOTE — Telephone Encounter (Signed)
Inbound call from patient requesting a call back discuss lab results.

## 2021-10-22 NOTE — Telephone Encounter (Signed)
See result note.  

## 2021-10-22 NOTE — Telephone Encounter (Signed)
See lab result note.

## 2021-10-27 ENCOUNTER — Ambulatory Visit (HOSPITAL_COMMUNITY): Payer: BC Managed Care – PPO

## 2021-10-28 ENCOUNTER — Telehealth: Payer: Self-pay | Admitting: Internal Medicine

## 2021-10-28 NOTE — Telephone Encounter (Signed)
Patient called requesting to speak with the nurse.

## 2021-10-28 NOTE — Telephone Encounter (Signed)
Returned patient's call. Patient stated that he received a text from [place] about his last visit and was calling to see what it was about. Told pt the only thing I could find in the computer was his last lab results, which he had already been notified.  Pt verbalized understanding.

## 2021-11-06 ENCOUNTER — Encounter: Payer: Self-pay | Admitting: Family Medicine

## 2021-11-06 ENCOUNTER — Ambulatory Visit: Payer: BC Managed Care – PPO | Admitting: Family Medicine

## 2021-11-06 VITALS — BP 137/86 | HR 89 | Temp 101.0°F | Ht 69.0 in | Wt 207.0 lb

## 2021-11-06 DIAGNOSIS — J069 Acute upper respiratory infection, unspecified: Secondary | ICD-10-CM

## 2021-11-06 DIAGNOSIS — R509 Fever, unspecified: Secondary | ICD-10-CM | POA: Diagnosis not present

## 2021-11-06 DIAGNOSIS — J101 Influenza due to other identified influenza virus with other respiratory manifestations: Secondary | ICD-10-CM

## 2021-11-06 LAB — VERITOR FLU A/B WAIVED
Influenza A: POSITIVE — AB
Influenza B: NEGATIVE

## 2021-11-06 MED ORDER — METHYLPREDNISOLONE ACETATE 40 MG/ML IJ SUSP
40.0000 mg | Freq: Once | INTRAMUSCULAR | Status: AC
Start: 2021-11-06 — End: 2021-11-06
  Administered 2021-11-06: 13:00:00 40 mg via INTRAMUSCULAR

## 2021-11-06 NOTE — Progress Notes (Signed)
°  ° °Subjective:  °Patient ID: Brian Estes, male    DOB: 12/18/1960, 60 y.o.   MRN: [id number] ° °Patient Care Team: °Hawks, Christy A, FNP as PCP - General (Family Medicine)  ° °Chief Complaint:  Nasal Congestion (Fever, cold) ° ° °HPI: °Brian Estes is a 60 y.o. male presenting on 11/06/2021 for Nasal Congestion (Fever, cold) ° ° °Patient presents today with complaints of upper respiratory congestion, cough, malaise, fever, and chills for 3 days.  He has been taking Tylenol and Flonase with some relief of symptoms.  ° °URI  °This is a new problem. The current episode started in the past 7 days. The maximum temperature recorded prior to his arrival was 102 - 102.9 F. Associated symptoms include congestion, coughing, headaches, a plugged ear sensation and wheezing. Pertinent negatives include no abdominal pain, chest pain, diarrhea, dysuria, ear pain, joint pain, joint swelling, nausea, neck pain, rash, rhinorrhea, sinus pain, sneezing, sore throat, swollen glands or vomiting. He has tried acetaminophen and decongestant for the symptoms. The treatment provided mild relief.  ° ° °Relevant past medical, surgical, family, and social history reviewed and updated as indicated.  °Allergies and medications reviewed and updated. Data reviewed: Chart in Epic. ° ° °Past Medical History:  °Diagnosis Date  ° Colitis   ° Diverticulosis of colon (without mention of hemorrhage)   ° Hyperlipemia   ° Hypertension   ° Internal hemorrhoids   ° Kidney disease   ° Other chronic nonalcoholic liver disease   ° Regional enteritis of unspecified site   ° Vitamin B12 deficiency   ° Vitamin D deficiency   ° ° °Past Surgical History:  °Procedure Laterality Date  ° APPENDECTOMY  2001  ° INGUINAL HERNIA REPAIR  1976  ° KNEE SURGERY  1999  ° right  ° ° °Social History  ° °Socioeconomic History  ° Marital status: Married  °  Spouse name: Not on file  ° Number of children: 2  ° Years of education: Not on file  ° Highest education level: Not on  file  °Occupational History  ° Occupation: self employed  °Tobacco Use  ° Smoking status: Never  ° Smokeless tobacco: Never  °Vaping Use  ° Vaping Use: Never used  °Substance and Sexual Activity  ° Alcohol use: Yes  °  Comment: rare  ° Drug use: No  ° Sexual activity: Not on file  °Other Topics Concern  ° Not on file  °Social History Narrative  ° Not on file  ° °Social Determinants of Health  ° °Financial Resource Strain: Not on file  °Food Insecurity: Not on file  °Transportation Needs: Not on file  °Physical Activity: Not on file  °Stress: Not on file  °Social Connections: Not on file  °Intimate Partner Violence: Not on file  ° ° °Outpatient Encounter Medications as of 11/06/2021  °Medication Sig  ° fluticasone (FLONASE) 50 MCG/ACT nasal spray Place into both nostrils daily.  ° linaclotide (LINZESS) 72 MCG capsule Take 1 capsule (72 mcg total) by mouth daily before breakfast.  ° olmesartan (BENICAR) 40 MG tablet TAKE 1 TABLET BY MOUTH EVERY DAY  ° [DISCONTINUED] linaclotide (LINZESS) 72 MCG capsule Take 72 mcg by mouth daily before breakfast.  ° °Facility-Administered Encounter Medications as of 11/06/2021  °Medication  ° methylPREDNISolone acetate (DEPO-MEDROL) injection 40 mg  ° ° °Allergies  °Allergen Reactions  ° Aspirin   ° Lipitor [Atorvastatin] Other (See Comments)  °  myalgia  ° ° °Review of Systems  °  Constitutional:  Positive for activity change, appetite change, chills, fatigue and fever. Negative for diaphoresis and unexpected weight change.  HENT:  Positive for congestion and postnasal drip. Negative for dental problem, drooling, ear discharge, ear pain, facial swelling, hearing loss, mouth sores, nosebleeds, rhinorrhea, sinus pressure, sinus pain, sneezing, sore throat, tinnitus, trouble swallowing and voice change.   Respiratory:  Positive for cough and wheezing. Negative for apnea, choking, chest tightness, shortness of breath and stridor.   Cardiovascular:  Negative for chest pain.   Gastrointestinal:  Negative for abdominal pain, diarrhea, nausea and vomiting.  Genitourinary:  Negative for decreased urine volume and dysuria.  Musculoskeletal:  Negative for joint pain and neck pain.  Skin:  Negative for rash.  Neurological:  Positive for headaches. Negative for dizziness, tremors, seizures, syncope, facial asymmetry, speech difficulty, weakness, light-headedness and numbness.  Psychiatric/Behavioral:  Negative for confusion.   All other systems reviewed and are negative.      Objective:  BP 137/86    Pulse 89    Temp (!) 101 F (38.3 C)    Ht 5' 9" (1.753 m)    Wt 207 lb (93.9 kg)    SpO2 99%    BMI 30.57 kg/m    Wt Readings from Last 3 Encounters:  11/06/21 207 lb (93.9 kg)  10/15/21 208 lb (94.3 kg)  09/29/21 207 lb 6.4 oz (94.1 kg)    Physical Exam Vitals and nursing note reviewed.  Constitutional:      General: He is not in acute distress.    Appearance: Normal appearance. He is obese. He is not ill-appearing, toxic-appearing or diaphoretic.  HENT:     Head: Normocephalic and atraumatic.     Right Ear: Hearing, ear canal and external ear normal. A middle ear effusion is present. Tympanic membrane is not erythematous.     Left Ear: Hearing, ear canal and external ear normal. A middle ear effusion is present. Tympanic membrane is not erythematous.     Nose: Congestion present.     Right Turbinates: Swollen.     Left Turbinates: Swollen.     Mouth/Throat:     Lips: Pink.     Mouth: Mucous membranes are moist.     Pharynx: Posterior oropharyngeal erythema present. No pharyngeal swelling, oropharyngeal exudate or uvula swelling.     Tonsils: No tonsillar exudate or tonsillar abscesses.  Eyes:     Conjunctiva/sclera: Conjunctivae normal.     Pupils: Pupils are equal, round, and reactive to light.  Cardiovascular:     Rate and Rhythm: Normal rate and regular rhythm.     Heart sounds: Normal heart sounds. No murmur heard.   No friction rub. No gallop.   Musculoskeletal:     Cervical back: Normal range of motion and neck supple.     Right lower leg: No edema.     Left lower leg: No edema.  Skin:    General: Skin is warm and dry.     Capillary Refill: Capillary refill takes less than 2 seconds.  Neurological:     General: No focal deficit present.     Mental Status: He is alert and oriented to person, place, and time.  Psychiatric:        Mood and Affect: Mood normal.        Behavior: Behavior normal.        Thought Content: Thought content normal.        Judgment: Judgment normal.    Results for orders placed or performed  in visit on 10/15/21  °Mitochondrial antibodies  °Result Value Ref Range  ° Mitochondrial M2 Ab, IgG <=20.0 <=20.0 U  °Anti-smooth muscle antibody, IgG  °Result Value Ref Range  ° Actin (Smooth Muscle) Antibody (IGG) 36 (H) <20 U  °Hepatitis C Antibody  °Result Value Ref Range  ° Hepatitis C Ab NON-REACTIVE NON-REACTIVE  ° SIGNAL TO CUT-OFF 0.05 <1.00  °Tissue transglutaminase, IgA  °Result Value Ref Range  ° (tTG) Ab, IgA <1.0 U/mL  °IgG  °Result Value Ref Range  ° IgG (Immunoglobin G), Serum 1,722 (H) 600 - 1,540 mg/dL  °ANA  °Result Value Ref Range  ° Anti Nuclear Antibody (ANA) NEGATIVE NEGATIVE  °INR/PT  °Result Value Ref Range  ° INR 1.1 (H) 0.8 - 1.0 ratio  ° Prothrombin Time 12.1 9.6 - 13.1 sec  °Comp Met (CMET)  °Result Value Ref Range  ° Sodium 138 135 - 145 mEq/L  ° Potassium 3.9 3.5 - 5.1 mEq/L  ° Chloride 104 96 - 112 mEq/L  ° CO2 26 19 - 32 mEq/L  ° Glucose, Bld 93 70 - 99 mg/dL  ° BUN 15 6 - 23 mg/dL  ° Creatinine, Ser 0.82 0.40 - 1.50 mg/dL  ° Total Bilirubin 0.8 0.2 - 1.2 mg/dL  ° Alkaline Phosphatase 77 39 - 117 U/L  ° AST 26 0 - 37 U/L  ° ALT 43 0 - 53 U/L  ° Total Protein 7.9 6.0 - 8.3 g/dL  ° Albumin 4.0 3.5 - 5.2 g/dL  ° GFR 94.98 >60.00 mL/min  ° Calcium 9.7 8.4 - 10.5 mg/dL  °CBC w/Diff  °Result Value Ref Range  ° WBC 7.9 4.0 - 10.5 K/uL  ° RBC 4.65 4.22 - 5.81 Mil/uL  ° Hemoglobin 14.6 13.0 - 17.0 g/dL  °  HCT 42.4 39.0 - 52.0 %  ° MCV 91.2 78.0 - 100.0 fl  ° MCHC 34.4 30.0 - 36.0 g/dL  ° RDW 13.0 11.5 - 15.5 %  ° Platelets 319.0 150.0 - 400.0 K/uL  ° Neutrophils Relative % 63.2 43.0 - 77.0 %  ° Lymphocytes Relative 20.3 12.0 - 46.0 %  ° Monocytes Relative 13.7 (H) 3.0 - 12.0 %  ° Eosinophils Relative 2.0 0.0 - 5.0 %  ° Basophils Relative 0.8 0.0 - 3.0 %  ° Neutro Abs 5.0 1.4 - 7.7 K/uL  ° Lymphs Abs 1.6 0.7 - 4.0 K/uL  ° Monocytes Absolute 1.1 (H) 0.1 - 1.0 K/uL  ° Eosinophils Absolute 0.2 0.0 - 0.7 K/uL  ° Basophils Absolute 0.1 0.0 - 0.1 K/uL  ° °   ° °Pertinent labs & imaging results that were available during my care of the patient were reviewed by me and considered in my medical decision making. ° °Assessment & Plan:  °Oluwanifemi was seen today for nasal congestion. ° °Diagnoses and all orders for this visit: ° °URI with cough and congestion °Fever and chills °Influenza A °Positive for influenza A, onset of symptoms greater than 48 hours ago, so will not initiate Tamiflu.  Continue symptomatic care with Tylenol as needed for fever and pain control, rest, increase fluid intake, and OTC medications for cough and congestion.  Will burst of steroids in office today.  Patient aware to report any new or worsening symptoms.  Follow-up as needed. °-     Veritor Flu A/B Waived °-     Novel Coronavirus, NAA ([facility]) °-     methylPREDNISolone acetate (DEPO-MEDROL) injection 40 mg °  ° °Continue all other maintenance medications. ° °  Follow up plan: Return if symptoms worsen or fail to improve.   Continue healthy lifestyle choices, including diet (rich in fruits, vegetables, and lean proteins, and low in salt and simple carbohydrates) and exercise (at least 30 minutes of moderate physical activity daily).  Educational handout given for URI  The above assessment and management plan was discussed with the patient. The patient verbalized understanding of and has agreed to the management plan. Patient is aware to call the  clinic if they develop any new symptoms or if symptoms persist or worsen. Patient is aware when to return to the clinic for a follow-up visit. Patient educated on when it is appropriate to go to the emergency department.   Monia Pouch, FNP-C [facility] [phone]

## 2021-11-07 ENCOUNTER — Ambulatory Visit: Payer: BC Managed Care – PPO | Admitting: Family Medicine

## 2021-11-07 LAB — NOVEL CORONAVIRUS, NAA: SARS-CoV-2, NAA: NOT DETECTED

## 2021-11-07 LAB — SARS-COV-2, NAA 2 DAY TAT

## 2021-11-25 ENCOUNTER — Telehealth: Payer: Self-pay | Admitting: Family Medicine

## 2021-11-25 NOTE — Telephone Encounter (Signed)
°  Prescription Request  11/25/2021  Is this a "Controlled Substance" medicine? no  Have you seen your PCP in the last 2 weeks? yes  If YES, route message to pool  -  If NO, patient needs to be scheduled for appointment.  What is the name of the medication or equipment? Pt had the flu 2 weeks ago and now has sinus infection. He wants to know if Sharyn Lull would call in something  Have you contacted your pharmacy to request a refill? no   Which pharmacy would you like this sent to? [facility]   Patient notified that their request is being sent to the clinical staff for review and that they should receive a response within 2 business days.

## 2021-11-26 ENCOUNTER — Other Ambulatory Visit: Payer: Self-pay | Admitting: Family Medicine

## 2021-11-26 DIAGNOSIS — J0140 Acute pansinusitis, unspecified: Secondary | ICD-10-CM

## 2021-11-26 MED ORDER — AMOXICILLIN-POT CLAVULANATE 875-125 MG PO TABS: 1.0000 | ORAL_TABLET | Freq: Two times a day (BID) | ORAL | 0 refills | Status: AC

## 2021-11-26 NOTE — Telephone Encounter (Signed)
Patient aware.

## 2021-12-07 ENCOUNTER — Other Ambulatory Visit: Payer: Self-pay | Admitting: Family

## 2021-12-07 DIAGNOSIS — I10 Essential (primary) hypertension: Secondary | ICD-10-CM

## 2021-12-08 NOTE — Telephone Encounter (Signed)
OV 09/29/21 RTC 1 yr

## 2022-03-30 ENCOUNTER — Telehealth: Payer: Self-pay | Admitting: Internal Medicine

## 2022-03-30 NOTE — Telephone Encounter (Signed)
Patient called , has an upcoming appointment scheduled for 04/15/2022 for a FU visit . Patient is inquiring if he should come to this appointment or if he should wait until his colonoscopy, which was due 07/10/21. Patient has not been scheduled for colonoscopy procedure as of yet. Please give a call back to advise.  Thank you.

## 2022-03-30 NOTE — Telephone Encounter (Signed)
Spoke with pt and let him know he should keep the OV as scheduled because per last OV note Dr. Hilarie Fredrickson was to discuss having colon done or possible surgery. Pt verbalized understanding.

## 2022-04-15 ENCOUNTER — Ambulatory Visit: Payer: BC Managed Care – PPO | Admitting: Internal Medicine

## 2022-04-15 ENCOUNTER — Encounter: Payer: Self-pay | Admitting: Internal Medicine

## 2022-04-15 VITALS — HR 85 | Ht 69.0 in | Wt 204.2 lb

## 2022-04-15 DIAGNOSIS — K760 Fatty (change of) liver, not elsewhere classified: Secondary | ICD-10-CM | POA: Diagnosis not present

## 2022-04-15 DIAGNOSIS — K5909 Other constipation: Secondary | ICD-10-CM | POA: Diagnosis not present

## 2022-04-15 DIAGNOSIS — R7989 Other specified abnormal findings of blood chemistry: Secondary | ICD-10-CM | POA: Diagnosis not present

## 2022-04-15 DIAGNOSIS — K5010 Crohn's disease of large intestine without complications: Secondary | ICD-10-CM | POA: Diagnosis not present

## 2022-04-15 MED ORDER — LINACLOTIDE 72 MCG PO CAPS: 72.0000 ug | ORAL_CAPSULE | Freq: Every day | ORAL | 3 refills | Status: DC

## 2022-04-15 NOTE — Progress Notes (Signed)
Subjective:    Patient ID: Brian Estes, male    DOB: 08-09-1961, 61 y.o.   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  He is here alone today and I last saw him in December 2022.  He reports that he is feeling very well.  He has no complaint.  He has continued Linzess 72 mcg daily.  He takes this most days.  Occasionally he will skip a dose if he has a busy morning or he has a golf game.  At times it can make his stools loose but it has definitely prevented constipation.  He has had no further abdominal pain and no crampy discomfort.  He does occasionally see mucus in the stool as well as blood.  At times he feels Linzess increases his intestinal gas.  Appetite is good.  Weight is stable.  Regarding his liver he has had no upper abdominal pain, itching, jaundice.  He canceled the ultrasound with elastography which we discussed after his last visit.  He states that he does not want to consider repeat lab work or liver biopsy at this time.  He states that he was told he had a fatty liver over 30 years ago and he does not feel that this will bother him.  He worries about medications.  He notes that his father who died in the last 2 years at age 74 was on 61 medications when he died and he wonders if the medications contributed to his demise.  He is also reduced his overall medication intake since his father passed.  He notes that he is taking Linzess and his Benicar.  He continues to be very active walking 3 miles a day and typically plays golf once a week.   Review of Systems As per HPI, otherwise negative  Current Medications, Allergies, Past Medical History, Past Surgical History, Family History and Social History were reviewed in [employer] record.     Objective:   Physical Exam Pulse 85   Ht 5' 9"  (1.753 m)   Wt 204 lb 3.2 oz (92.6 kg)   SpO2 96%   BMI 30.16 kg/m  Gen: awake, alert, NAD HEENT: anicteric  Abd: soft, NT/ND, +BS throughout Ext: no c/c/e Neuro: nonfocal  Lab Results  Component Value Date   INR 1.1 (H) 10/15/2021   CMP     Component Value Date/Time   NA 138 10/15/2021 0928   NA 139 09/29/2021 0948   K 3.9 10/15/2021 0928   CL 104 10/15/2021 0928   CO2 26 10/15/2021 0928   GLUCOSE 93 10/15/2021 0928   BUN 15 10/15/2021 0928   BUN 12 09/29/2021 0948   CREATININE 0.82 10/15/2021 0928   CREATININE 0.87 05/16/2013 0859   CALCIUM 9.7 10/15/2021 0928   PROT 7.9 10/15/2021 0928   PROT 7.5 09/29/2021 0948   ALBUMIN 4.0 10/15/2021 0928   ALBUMIN 4.2 09/29/2021 0948   AST 26 10/15/2021 0928   ALT 43 10/15/2021 0928   ALKPHOS 77 10/15/2021 0928   BILITOT 0.8 10/15/2021 0928   BILITOT 0.4 09/29/2021 0948   GFRNONAA 83 03/25/2020 0847   GFRNONAA >89 05/16/2013 0859   GFRAA 96 03/25/2020 0847   GFRAA >89 05/16/2013 0859      Latest Ref Rng & Units 10/15/2021  9:28 AM 09/29/2021    9:48 AM 03/25/2020    8:47 AM  CBC  WBC 4.0 - 10.5 K/uL 7.9   9.3   7.0    Hemoglobin 13.0 - 17.0 g/dL 14.6   15.0   15.0    Hematocrit 39.0 - 52.0 % 42.4   43.9   43.1    Platelets 150.0 - 400.0 K/uL 319.0   385   328         Assessment & Plan:  61 year old male with a history of 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  Segmental colitis associated with diverticulosis/chronic constipation --I favor SCAD over Crohn's disease though this has been a longstanding process in the sigmoid colon.  We discussed this today.  He is currently very happy with treatment for chronic constipation with Linzess 72 mcg daily.  We discussed surveillance colonoscopy to evaluate disease activity but  after this discussion he is not interested in any escalation of therapy should this be Crohn's disease.  He would not be interested in a biologic or small molecule medication and has previously not tolerated 5-ASA.  For this reason and after thorough discussion we decided not to pursue colonoscopy until next year which will be 5 years from his last exam. --Continue Linzess 72 mcg daily --Surveillance colonoscopy recommended June 2024  2.  Fatty liver/elevated liver enzymes --he has been diagnosed with fatty liver years ago and has had intermittently elevated AST and ALT.  It should be noted that he had an elevated anti-smooth muscle antibody as well as an elevated IgG.  This at least raises the question of concomitant autoimmune hepatitis.  Again he is not interested in repeat labs at this time or any additional therapy.  He previously canceled his elastography.  There is no clinical evidence for advanced liver disease.  He does not want escalation of any therapy nor repeat lab work.  Fortunately AST and ALT were normal we checked them last in December. --Continue risk factor modification with daily exercise and controlling blood pressure.  1 year follow-up.  I asked that he call the office in March for a May 2020 for follow-up, sooner if needed  30 minutes total spent today including patient facing time, coordination of care, reviewing medical history/procedures/pertinent radiology studies, and documentation of the encounter.

## 2022-04-15 NOTE — Patient Instructions (Signed)
If you are age 62 or older, your body mass index should be between 23-30. Your Body mass index is 30.16 kg/m. If this is out of the aforementioned range listed, please consider follow up with your Primary Care Provider.  If you are age 42 or younger, your body mass index should be between 19-25. Your Body mass index is 30.16 kg/m. If this is out of the aformentioned range listed, please consider follow up with your Primary Care Provider.   ________________________________________________________  The [facility] providers would like to encourage you to use [facility] to communicate with providers for non-urgent requests or questions.  Due to long hold times on the telephone, sending your provider a message by [facility] may be a faster and more efficient way to get a response.  Please allow 48 business hours for a response.  Please remember that this is for non-urgent requests.  _______________________________________________________   Please follow up in one year

## 2022-08-22 ENCOUNTER — Other Ambulatory Visit: Payer: Self-pay | Admitting: Family

## 2022-08-22 DIAGNOSIS — I10 Essential (primary) hypertension: Secondary | ICD-10-CM

## 2022-09-24 ENCOUNTER — Emergency Department (HOSPITAL_COMMUNITY)
Admission: EM | Admit: 2022-09-24 | Discharge: 2022-09-24 | Disposition: A | Discharge status: Home / Self Care | Payer: BC Managed Care – PPO | Attending: Emergency Medicine | Admitting: Emergency Medicine

## 2022-09-24 ENCOUNTER — Encounter (HOSPITAL_COMMUNITY): Payer: Self-pay | Admitting: Emergency Medicine

## 2022-09-24 ENCOUNTER — Telehealth: Payer: Self-pay | Admitting: Internal Medicine

## 2022-09-24 ENCOUNTER — Emergency Department (HOSPITAL_COMMUNITY): Payer: BC Managed Care – PPO

## 2022-09-24 DIAGNOSIS — R319 Hematuria, unspecified: Secondary | ICD-10-CM | POA: Diagnosis present

## 2022-09-24 DIAGNOSIS — N132 Hydronephrosis with renal and ureteral calculous obstruction: Secondary | ICD-10-CM | POA: Diagnosis not present

## 2022-09-24 DIAGNOSIS — N200 Calculus of kidney: Secondary | ICD-10-CM

## 2022-09-24 LAB — URINALYSIS, ROUTINE W REFLEX MICROSCOPIC
Bacteria, UA: NONE SEEN
Bilirubin Urine: NEGATIVE
Glucose, UA: NEGATIVE mg/dL
Ketones, ur: NEGATIVE mg/dL
Leukocytes,Ua: NEGATIVE
Nitrite: NEGATIVE
Protein, ur: NEGATIVE mg/dL
Specific Gravity, Urine: 1.012 (ref 1.005–1.030)
pH: 7 (ref 5.0–8.0)

## 2022-09-24 LAB — COMPREHENSIVE METABOLIC PANEL
ALT: 28 U/L (ref 0–44)
AST: 26 U/L (ref 15–41)
Albumin: 3.9 g/dL (ref 3.5–5.0)
Alkaline Phosphatase: 82 U/L (ref 38–126)
Anion gap: 10 (ref 5–15)
BUN: 17 mg/dL (ref 8–23)
CO2: 22 mmol/L (ref 22–32)
Calcium: 9.2 mg/dL (ref 8.9–10.3)
Chloride: 106 mmol/L (ref 98–111)
Creatinine, Ser: 1.02 mg/dL (ref 0.61–1.24)
GFR, Estimated: >60 mL/min (ref >60)
Glucose, Bld: 110 mg/dL — ABNORMAL HIGH (ref 70–99)
Potassium: 3.2 mmol/L — ABNORMAL LOW (ref 3.5–5.1)
Sodium: 138 mmol/L (ref 135–145)
Total Bilirubin: 1.1 mg/dL (ref 0.3–1.2)
Total Protein: 8 g/dL (ref 6.5–8.1)

## 2022-09-24 LAB — CBC WITH DIFFERENTIAL/PLATELET
Abs Immature Granulocytes: 0.02 10*3/uL (ref 0.00–0.07)
Basophils Absolute: 0.1 10*3/uL (ref 0.0–0.1)
Basophils Relative: 1 %
Eosinophils Absolute: 0.2 10*3/uL (ref 0.0–0.5)
Eosinophils Relative: 2 %
HCT: 43.8 % (ref 39.0–52.0)
Hemoglobin: 15.1 g/dL (ref 13.0–17.0)
Immature Granulocytes: 0 %
Lymphocytes Relative: 31 %
Lymphs Abs: 2.7 10*3/uL (ref 0.7–4.0)
MCH: 30.9 pg (ref 26.0–34.0)
MCHC: 34.5 g/dL (ref 30.0–36.0)
MCV: 89.6 fL (ref 80.0–100.0)
Monocytes Absolute: 0.9 10*3/uL (ref 0.1–1.0)
Monocytes Relative: 10 %
Neutro Abs: 4.9 10*3/uL (ref 1.7–7.7)
Neutrophils Relative %: 56 %
Platelets: 381 10*3/uL (ref 150–400)
RBC: 4.89 MIL/uL (ref 4.22–5.81)
RDW: 13 % (ref 11.5–15.5)
WBC: 8.6 10*3/uL (ref 4.0–10.5)
nRBC: 0 % (ref 0.0–0.2)

## 2022-09-24 LAB — LIPASE, BLOOD: Lipase: 28 U/L (ref 11–51)

## 2022-09-24 MED ORDER — HYDROMORPHONE HCL 1 MG/ML IJ SOLN
1.0000 mg | Freq: Once | INTRAMUSCULAR | Status: AC
  Administered 2022-09-24: 1 mg via INTRAVENOUS
  Filled 2022-09-24: qty 1

## 2022-09-24 MED ORDER — TAMSULOSIN HCL 0.4 MG PO CAPS: 0.4000 mg | ORAL_CAPSULE | Freq: Every day | ORAL | 0 refills | Status: DC

## 2022-09-24 MED ORDER — OXYCODONE-ACETAMINOPHEN 5-325 MG PO TABS: 1.0000 | ORAL_TABLET | Freq: Four times a day (QID) | ORAL | 0 refills | Status: DC | PRN

## 2022-09-24 MED ORDER — LACTATED RINGERS IV BOLUS
1000.0000 mL | Freq: Once | INTRAVENOUS | Status: AC
  Administered 2022-09-24: 1000 mL via INTRAVENOUS

## 2022-09-24 MED ORDER — ONDANSETRON HCL 4 MG PO TABS: 4.0000 mg | ORAL_TABLET | Freq: Three times a day (TID) | ORAL | 0 refills | Status: DC | PRN

## 2022-09-24 MED ORDER — ONDANSETRON 4 MG PO TBDP: ORAL_TABLET | ORAL | 0 refills | Status: DC

## 2022-09-24 MED ORDER — ONDANSETRON HCL 4 MG/2ML IJ SOLN
4.0000 mg | Freq: Once | INTRAMUSCULAR | Status: AC
  Administered 2022-09-24: 4 mg via INTRAVENOUS
  Filled 2022-09-24: qty 2

## 2022-09-24 MED ORDER — KETOROLAC TROMETHAMINE 30 MG/ML IJ SOLN
15.0000 mg | Freq: Once | INTRAMUSCULAR | Status: AC
  Administered 2022-09-24: 15 mg via INTRAVENOUS
  Filled 2022-09-24: qty 1

## 2022-09-24 MED ORDER — OXYCODONE-ACETAMINOPHEN 5-325 MG PO TABS
1.0000 | ORAL_TABLET | Freq: Once | ORAL | Status: AC
  Administered 2022-09-24: 1 via ORAL
  Filled 2022-09-24: qty 1

## 2022-09-24 MED ORDER — OXYCODONE-ACETAMINOPHEN 5-325 MG PO TABS: 2.0000 | ORAL_TABLET | ORAL | 0 refills | Status: DC | PRN

## 2022-09-24 NOTE — ED Triage Notes (Signed)
Pt c/o left sided flank pain that started yesterday but went away until tonight. Pt c/o nausea with increased pain. Hx of kidney stones, states this feels the same.

## 2022-09-24 NOTE — Telephone Encounter (Signed)
Patient called regarding recent ED visit, states they took pictures of his kidney stones and was wondering if that is sufficient for the imaging Dr Hilarie Fredrickson was wanting. Please advise

## 2022-09-24 NOTE — ED Provider Notes (Signed)
[facility] EMERGENCY DEPARTMENT Provider Note   CSN: [id number] Arrival date & time: 09/24/22  0320     History  Chief Complaint  Patient presents with   Flank Pain    Brian Estes is a 61 y.o. male.  61 year old male who has a history of kidney stones, Crohn's disease and diverticulitis presents the ER today with left-sided pain.  Patient states that radiates around to his abdomen.  States this started earlier in the week but then went away.  Had another flare of it last night but once again it went away.  He is able to go to sleep.  He was woken today with severe pain causing nausea and vomiting.  Did have some emesis couple days ago as well.  Has not eaten or drinking much recently because of that.  This pain was severe and did not improve so he came here for further evaluation.  He states it feels most like his previous kidney stones but is unsure because of his other comorbidities.  No fevers.  No blood in his vomit.  No diarrhea or blood in his stools.   Flank Pain       Home Medications Prior to Admission medications   Medication Sig Start Date End Date Taking? Authorizing Provider  ondansetron (ZOFRAN) 4 MG tablet Take 1 tablet (4 mg total) by mouth every 8 (eight) hours as needed for nausea or vomiting. 09/24/22  Yes Navjot Loera, Corene Cornea, MD  ondansetron (ZOFRAN-ODT) 4 MG disintegrating tablet 10m ODT q4 hours prn nausea/vomit 09/24/22  Yes Joah Patlan, JCorene Cornea MD  oxyCODONE-acetaminophen (PERCOCET) 5-325 MG tablet Take 2 tablets by mouth every 4 (four) hours as needed. 09/24/22  Yes Maxximus Gotay, JCorene Cornea MD  oxyCODONE-acetaminophen (PERCOCET/ROXICET) 5-325 MG tablet Take 1 tablet by mouth every 6 (six) hours as needed for severe pain. 09/24/22  Yes Alvar Malinoski, JCorene Cornea MD  tamsulosin (FLOMAX) 0.4 MG CAPS capsule Take 1 capsule (0.4 mg total) by mouth daily. Until stone passes 09/24/22  Yes Gregoire Bennis, JCorene Cornea MD  linaclotide ([facility] 72 MCG capsule Take 1 capsule (72 mcg total) by mouth daily before  breakfast. 04/15/22   Pyrtle, JLajuan Lines MD  olmesartan (BENICAR) 40 MG tablet TAKE 1 TABLET BY MOUTH EVERY DAY 08/24/22   HSharion Balloon FNP      Allergies    Aspirin and Lipitor [atorvastatin]    Review of Systems   Review of Systems  Genitourinary:  Positive for flank pain.    Physical Exam Updated Vital Signs BP (!) 159/91   Pulse 67   Temp 98.3 F (36.8 C) (Oral)   Resp 17   Ht 5' 9"  (1.753 m)   Wt 92.6 kg   SpO2 98%   BMI 30.15 kg/m  Physical Exam Vitals and nursing note reviewed.  Constitutional:      Appearance: He is well-developed.  HENT:     Head: Normocephalic and atraumatic.  Cardiovascular:     Rate and Rhythm: Normal rate.  Pulmonary:     Effort: Pulmonary effort is normal. No respiratory distress.  Abdominal:     General: There is no distension.     Tenderness: There is no abdominal tenderness. There is no right CVA tenderness, left CVA tenderness, guarding or rebound.  Musculoskeletal:        General: Normal range of motion.     Cervical back: Normal range of motion.  Skin:    General: Skin is warm and dry.  Neurological:     Mental Status: He is alert.  ED Results / Procedures / Treatments   Labs (all labs ordered are listed, but only abnormal results are displayed) Labs Reviewed  COMPREHENSIVE METABOLIC PANEL - Abnormal; Notable for the following components:      Result Value   Potassium 3.2 (*)    Glucose, Bld 110 (*)    All other components within normal limits  URINALYSIS, ROUTINE W REFLEX MICROSCOPIC - Abnormal; Notable for the following components:   Hgb urine dipstick SMALL (*)    All other components within normal limits  CBC WITH DIFFERENTIAL/PLATELET  LIPASE, BLOOD    EKG None  Radiology CT Renal Stone Study  Result Date: 09/24/2022 CLINICAL DATA:  Abdominal/flank pain with kidney stone suspected EXAM: CT ABDOMEN AND PELVIS WITHOUT CONTRAST TECHNIQUE: Multidetector CT imaging of the abdomen and pelvis was performed  following the standard protocol without IV contrast. RADIATION DOSE REDUCTION: This exam was performed according to the departmental dose-optimization program which includes automated exposure control, adjustment of the mA and/or kV according to patient size and/or use of iterative reconstruction technique. COMPARISON:  None available FINDINGS: Lower chest: No acute finding. Extensive coronary atheromatous calcification. Distended esophageal vestibule versus is tiny sliding hiatal hernia. Hepatobiliary: Liver steatosis with pericholecystic sparing.No evidence of biliary obstruction or stone. Pancreas: Unremarkable. Spleen: Unremarkable. Adrenals/Urinary Tract: Negative adrenals. Low-density left renal expansion with extensive perinephric stranding and mild hydronephrosis. Underlying proximal ureteral calculus measuring 4 mm at the level of the left flank. No additional nephrolithiasis. Unremarkable bladder. Stomach/Bowel: Extensive sigmoid diverticulosis with innumerable diverticula and segmental wall thickening usually from muscular hypertrophy. Accentuated sigmoid mesenteric nodes. No bowel obstruction. Appendectomy. Vascular/Lymphatic: Extensive atheromatous calcification. No mass or adenopathy. Reproductive:No pathologic findings. Other: No ascites or pneumoperitoneum. Musculoskeletal: Advanced and generalized lumbar spine degeneration with mild scoliosis. IMPRESSION: 1. Marked urinary obstructive findings on the left due to a proximal 4 mm ureteral calculus. 2. Severe diverticulosis of the sigmoid colon. Accentuated adjacent lymph nodes may be related to chronic inflammation, please ensure patient is up-to-date with colonoscopic screening. 3. Hepatic steatosis. 4. Atherosclerosis including extensive coronary calcification. Electronically Signed   By: Jorje Guild M.D.   On: 09/24/2022 05:10    Procedures Procedures    Medications Ordered in ED Medications  ondansetron (ZOFRAN) injection 4 mg (4 mg  Intravenous Given 09/24/22 0407)  HYDROmorphone (DILAUDID) injection 1 mg (1 mg Intravenous Given 09/24/22 0407)  lactated ringers bolus 1,000 mL (0 mLs Intravenous Stopped 09/24/22 0513)  HYDROmorphone (DILAUDID) injection 1 mg (1 mg Intravenous Given 09/24/22 0528)  ketorolac (TORADOL) 30 MG/ML injection 15 mg (15 mg Intravenous Given 09/24/22 0528)  oxyCODONE-acetaminophen (PERCOCET/ROXICET) 5-325 MG per tablet 1 tablet (1 tablet Oral Given 09/24/22 2563)    ED Course/ Medical Decision Making/ A&P                           Medical Decision Making Amount and/or Complexity of Data Reviewed Labs: ordered. Radiology: ordered.  Risk Prescription drug management.   Patient had microscopic hematuria and history of kidney stones with CT stones but it was done.  Reviewed myself and shows he does have a kidney stone.  Couple doses of Dilaudid and Toradol given.  Fluids and nausea meds given has significant improvement.  Will reassess for likely discharge on home treatments and urology follow-up.  Improved symptoms. Ready for discharge.    Final Clinical Impression(s) / ED Diagnoses Final diagnoses:  Kidney stone    Rx / DC Orders ED Discharge Orders  Ordered    ondansetron (ZOFRAN) 4 MG tablet  Every 8 hours PRN        09/24/22 0626    oxyCODONE-acetaminophen (PERCOCET/ROXICET) 5-325 MG tablet  Every 6 hours PRN        09/24/22 0626    oxyCODONE-acetaminophen (PERCOCET) 5-325 MG tablet  Every 4 hours PRN        09/24/22 0628    ondansetron (ZOFRAN-ODT) 4 MG disintegrating tablet        09/24/22 0628    tamsulosin (FLOMAX) 0.4 MG CAPS capsule  Daily        09/24/22 0628              Dior Dominik, Corene Cornea, MD 09/24/22 [phone]

## 2022-09-24 NOTE — Telephone Encounter (Signed)
Spoke with pt and let him know the test he had in the ER is not the [place] with elastography that Dr. Norman Herrlich wants him to have. He verbalized understanding.

## 2022-09-25 ENCOUNTER — Telehealth: Payer: Self-pay

## 2022-09-25 MED FILL — Oxycodone w/ Acetaminophen Tab 5-325 MG: ORAL | Qty: 6 | Status: AC

## 2022-09-25 MED FILL — Ondansetron HCl Tab 4 MG: ORAL | Qty: 4 | Status: AC

## 2022-09-25 NOTE — Telephone Encounter (Signed)
Transition Care Management Follow-up Telephone Call Date of discharge and from where: 09/24/2022 [facility]  How have you been since you were released from the hospital? Patient states that he is doing well - still having pain at times. He show urologist today - has not passed stone yet Any questions or concerns? Yes - patient is concerned about running out of pain medication over the weekend   Items Reviewed: Did the pt receive and understand the discharge instructions provided? Yes  Medications obtained and verified? Yes  Other? Yes  Any new allergies since your discharge? No  Dietary orders reviewed? Yes Do you have support at home? Yes   Home Care and Equipment/Supplies: Were home health services ordered? not applicable If so, what is the name of the agency? [facility]  Has the agency set up a time to come to the patient's home? not applicable Were any new equipment or medical supplies ordered?  No What is the name of the medical supply agency? [facility] Were you able to get the supplies/equipment? not applicable Do you have any questions related to the use of the equipment or supplies? No  Functional Questionnaire: (I = Independent and D = Dependent) ADLs: i  Bathing/Dressing- i  Meal Prep- i  Eating- i  Maintaining continence- i  Transferring/Ambulation- i  Managing Meds- i  Follow up appointments reviewed:  PCP Hospital f/u appt confirmed?  Declines at this time will follow as needed [facility] f/u appt confirmed? Yes  Scheduled to see urology - patient had appt tooday 09/25/22 Are transportation arrangements needed?No  If their condition worsens, is the pt aware to call PCP or go to the Emergency Dept.? Yes Was the patient provided with contact information for the PCP's office or ED? Yes Was to pt encouraged to call back with questions or concerns? Yes

## 2022-10-05 ENCOUNTER — Encounter: Payer: Self-pay | Admitting: Family

## 2022-10-05 ENCOUNTER — Ambulatory Visit (INDEPENDENT_AMBULATORY_CARE_PROVIDER_SITE_OTHER): Payer: BC Managed Care – PPO | Admitting: Family

## 2022-10-05 VITALS — BP 127/72 | HR 60 | Temp 98.5°F | Ht 69.0 in | Wt 195.4 lb

## 2022-10-05 DIAGNOSIS — Z789 Other specified health status: Secondary | ICD-10-CM

## 2022-10-05 DIAGNOSIS — Z0001 Encounter for general adult medical examination with abnormal findings: Secondary | ICD-10-CM | POA: Diagnosis not present

## 2022-10-05 DIAGNOSIS — N400 Enlarged prostate without lower urinary tract symptoms: Secondary | ICD-10-CM | POA: Diagnosis not present

## 2022-10-05 DIAGNOSIS — I10 Essential (primary) hypertension: Secondary | ICD-10-CM

## 2022-10-05 DIAGNOSIS — K5900 Constipation, unspecified: Secondary | ICD-10-CM

## 2022-10-05 DIAGNOSIS — Z0000 Encounter for general adult medical examination without abnormal findings: Secondary | ICD-10-CM

## 2022-10-05 DIAGNOSIS — E782 Mixed hyperlipidemia: Secondary | ICD-10-CM

## 2022-10-05 DIAGNOSIS — K760 Fatty (change of) liver, not elsewhere classified: Secondary | ICD-10-CM

## 2022-10-05 MED ORDER — OLMESARTAN MEDOXOMIL 40 MG PO TABS: 40.0000 mg | ORAL_TABLET | Freq: Every day | ORAL | 2 refills | Status: DC

## 2022-10-05 NOTE — Progress Notes (Signed)
Subjective:    Patient ID: Brian Estes, male    DOB: 27-May-1961, 61 y.o.   MRN: [id number]  Chief Complaint  Patient presents with   Annual Exam   PT presents to the office today to CPE.  Pt is followed by Dermatologists annually for lichen simplex. He reports he walks 3 miles everyday. He is followed by GI every 3 years for colonoscopy for segmental colitis.     He can't tolerate statins because of muscle pain.  Hypertension This is a chronic problem. The current episode started more than 1 year ago. The problem has been waxing and waning since onset. The problem is uncontrolled. Pertinent negatives include no malaise/fatigue, peripheral edema or shortness of breath. Risk factors for coronary artery disease include dyslipidemia, obesity, male gender and sedentary lifestyle. The current treatment provides moderate improvement.  Benign Prostatic Hypertrophy This is a chronic problem. The current episode started more than 1 year ago. The problem has been waxing and waning since onset. Irritative symptoms include nocturia (1). Irritative symptoms do not include urgency. Obstructive symptoms do not include straining.  Hyperlipidemia This is a chronic problem. The current episode started more than 1 year ago. The problem is uncontrolled. Pertinent negatives include no shortness of breath. Current antihyperlipidemic treatment includes diet change. The current treatment provides no improvement of lipids. Risk factors for coronary artery disease include dyslipidemia, male sex, hypertension and a sedentary lifestyle.  Constipation This is a chronic problem. The current episode started more than 1 year ago. The problem is unchanged. Risk factors include obesity. He has tried diet changes for the symptoms. The treatment provided mild relief.      Review of Systems  Constitutional:  Negative for malaise/fatigue.  Respiratory:  Negative for shortness of breath.   Gastrointestinal:  Positive for  constipation.  Genitourinary:  Positive for nocturia (1). Negative for urgency.  All other systems reviewed and are negative.      Objective:   Physical Exam Vitals reviewed.  Constitutional:      General: He is not in acute distress.    Appearance: He is well-developed.  HENT:     Head: Normocephalic.     Right Ear: Tympanic membrane normal.     Left Ear: Tympanic membrane normal.  Eyes:     General:        Right eye: No discharge.        Left eye: No discharge.     Pupils: Pupils are equal, round, and reactive to light.  Neck:     Thyroid: No thyromegaly.  Cardiovascular:     Rate and Rhythm: Normal rate and regular rhythm.     Heart sounds: Normal heart sounds. No murmur heard. Pulmonary:     Effort: Pulmonary effort is normal. No respiratory distress.     Breath sounds: Normal breath sounds. No wheezing.  Abdominal:     General: Bowel sounds are normal. There is no distension.     Palpations: Abdomen is soft.     Tenderness: There is no abdominal tenderness.  Musculoskeletal:        General: No tenderness. Normal range of motion.     Cervical back: Normal range of motion and neck supple.  Skin:    General: Skin is warm and dry.     Findings: No erythema or rash.  Neurological:     Mental Status: He is alert and oriented to person, place, and time.     Cranial Nerves: No cranial nerve deficit.  Deep Tendon Reflexes: Reflexes are normal and symmetric.  Psychiatric:        Behavior: Behavior normal.        Thought Content: Thought content normal.        Judgment: Judgment normal.       BP 127/72 Comment: patient reports at home  Pulse 60   Temp 98.5 F (36.9 C) (Temporal)   Ht _0  (1.753 m)   Wt 195 lb 6.4 oz (88.6 kg)   SpO2 97%   BMI 28.86 kg/m      Assessment & Plan:  Brian Estes comes in today with chief complaint of Annual Exam   Diagnosis and orders addressed:  1. Essential hypertension - olmesartan (BENICAR) 40 MG tablet; Take 1  tablet (40 mg total) by mouth daily.  Dispense: 90 tablet; Refill: 2 - CMP14+EGFR  2. Annual physical exam - CMP14+EGFR - PSA, total and free - TSH  3. Benign prostatic hyperplasia without lower urinary tract symptoms - CMP14+EGFR  4. Constipation, unspecified constipation type - CMP14+EGFR  5. Fatty liver - CMP14+EGFR  6. Mixed hyperlipidemia - CMP14+EGFR  7. Statin intolerance - CMP14+EGFR   Labs pending Health Maintenance reviewed Diet and exercise encouraged  Follow up plan: 1 year    Evelina Dun, FNP

## 2022-10-05 NOTE — Patient Instructions (Signed)
Health Maintenance, Male Adopting a healthy lifestyle and getting preventive care are important in promoting health and wellness. Ask your health care provider about: The right schedule for you to have regular tests and exams. Things you can do on your own to prevent diseases and keep yourself healthy. What should I know about diet, weight, and exercise? Eat a healthy diet  Eat a diet that includes plenty of vegetables, fruits, low-fat dairy products, and lean protein. Do not eat a lot of foods that are high in solid fats, added sugars, or sodium. Maintain a healthy weight Body mass index (BMI) is a measurement that can be used to identify possible weight problems. It estimates body fat based on height and weight. Your health care provider can help determine your BMI and help you achieve or maintain a healthy weight. Get regular exercise Get regular exercise. This is one of the most important things you can do for your health. Most adults should: Exercise for at least 150 minutes each week. The exercise should increase your heart rate and make you sweat (moderate-intensity exercise). Do strengthening exercises at least twice a week. This is in addition to the moderate-intensity exercise. Spend less time sitting. Even light physical activity can be beneficial. Watch cholesterol and blood lipids Have your blood tested for lipids and cholesterol at 62 years of age, then have this test every 5 years. You may need to have your cholesterol levels checked more often if: Your lipid or cholesterol levels are high. You are older than 62 years of age. You are at high risk for heart disease. What should I know about cancer screening? Many types of cancers can be detected early and may often be prevented. Depending on your health history and family history, you may need to have cancer screening at various ages. This may include screening for: Colorectal cancer. Prostate cancer. Skin cancer. Lung  cancer. What should I know about heart disease, diabetes, and high blood pressure? Blood pressure and heart disease High blood pressure causes heart disease and increases the risk of stroke. This is more likely to develop in people who have high blood pressure readings or are overweight. Talk with your health care provider about your target blood pressure readings. Have your blood pressure checked: Every 3-5 years if you are 18-39 years of age. Every year if you are 40 years old or older. If you are between the ages of 65 and 75 and are a current or former smoker, ask your health care provider if you should have a one-time screening for abdominal aortic aneurysm (AAA). Diabetes Have regular diabetes screenings. This checks your fasting blood sugar level. Have the screening done: Once every three years after age 45 if you are at a normal weight and have a low risk for diabetes. More often and at a younger age if you are overweight or have a high risk for diabetes. What should I know about preventing infection? Hepatitis B If you have a higher risk for hepatitis B, you should be screened for this virus. Talk with your health care provider to find out if you are at risk for hepatitis B infection. Hepatitis C Blood testing is recommended for: Everyone born from 1945 through 1965. Anyone with known risk factors for hepatitis C. Sexually transmitted infections (STIs) You should be screened each year for STIs, including gonorrhea and chlamydia, if: You are sexually active and are younger than 62 years of age. You are older than 62 years of age and your   health care provider tells you that you are at risk for this type of infection. Your sexual activity has changed since you were last screened, and you are at increased risk for chlamydia or gonorrhea. Ask your health care provider if you are at risk. Ask your health care provider about whether you are at high risk for HIV. Your health care provider  may recommend a prescription medicine to help prevent HIV infection. If you choose to take medicine to prevent HIV, you should first get tested for HIV. You should then be tested every 3 months for as long as you are taking the medicine. Follow these instructions at home: Alcohol use Do not drink alcohol if your health care provider tells you not to drink. If you drink alcohol: Limit how much you have to 0-2 drinks a day. Know how much alcohol is in your drink. In the [place] one drink equals one 12 oz bottle of beer (355 mL), one 5 oz glass of wine (148 mL), or one 1 oz glass of hard liquor (44 mL). Lifestyle Do not use any products that contain nicotine or tobacco. These products include cigarettes, chewing tobacco, and vaping devices, such as e-cigarettes. If you need help quitting, ask your health care provider. Do not use street drugs. Do not share needles. Ask your health care provider for help if you need support or information about quitting drugs. General instructions Schedule regular health, dental, and eye exams. Stay current with your vaccines. Tell your health care provider if: You often feel depressed. You have ever been abused or do not feel safe at home. Summary Adopting a healthy lifestyle and getting preventive care are important in promoting health and wellness. Follow your health care provider's instructions about healthy diet, exercising, and getting tested or screened for diseases. Follow your health care provider's instructions on monitoring your cholesterol and blood pressure. This information is not intended to replace advice given to you by your health care provider. Make sure you discuss any questions you have with your health care provider. Document Revised: 03/17/2021 Document Reviewed: 03/17/2021 Elsevier Patient Education  2023 Elsevier Inc.  

## 2022-10-06 LAB — CMP14+EGFR
ALT: 25 IU/L (ref 0–44)
AST: 20 IU/L (ref 0–40)
Albumin/Globulin Ratio: 1.5 (ref 1.2–2.2)
Albumin: 4.4 g/dL (ref 3.9–4.9)
Alkaline Phosphatase: 96 IU/L (ref 44–121)
BUN/Creatinine Ratio: 19 (ref 10–24)
BUN: 17 mg/dL (ref 8–27)
Bilirubin Total: 0.4 mg/dL (ref 0.0–1.2)
CO2: 22 mmol/L (ref 20–29)
Calcium: 9.7 mg/dL (ref 8.6–10.2)
Chloride: 105 mmol/L (ref 96–106)
Creatinine, Ser: 0.89 mg/dL (ref 0.76–1.27)
Globulin, Total: 2.9 g/dL (ref 1.5–4.5)
Glucose: 93 mg/dL (ref 70–99)
Potassium: 4.1 mmol/L (ref 3.5–5.2)
Sodium: 141 mmol/L (ref 134–144)
Total Protein: 7.3 g/dL (ref 6.0–8.5)
eGFR: 97 mL/min/{1.73_m2} (ref >59)

## 2022-10-06 LAB — TSH: TSH: 2.02 u[IU]/mL (ref 0.450–4.500)

## 2022-10-06 LAB — PSA, TOTAL AND FREE
PSA, Free Pct: 32.9 %
PSA, Free: 0.23 ng/mL
Prostate Specific Ag, Serum: 0.7 ng/mL (ref 0.0–4.0)

## 2022-12-07 ENCOUNTER — Encounter: Payer: Self-pay | Admitting: *Deleted

## 2023-01-05 ENCOUNTER — Ambulatory Visit: Payer: BC Managed Care – PPO | Admitting: Internal Medicine

## 2023-01-05 ENCOUNTER — Encounter: Payer: Self-pay | Admitting: Internal Medicine

## 2023-01-05 VITALS — BP 138/86 | HR 65 | Ht 70.0 in | Wt 195.0 lb

## 2023-01-05 DIAGNOSIS — K760 Fatty (change of) liver, not elsewhere classified: Secondary | ICD-10-CM | POA: Diagnosis not present

## 2023-01-05 DIAGNOSIS — K5730 Diverticulosis of large intestine without perforation or abscess without bleeding: Secondary | ICD-10-CM | POA: Diagnosis not present

## 2023-01-05 DIAGNOSIS — K5010 Crohn's disease of large intestine without complications: Secondary | ICD-10-CM

## 2023-01-05 DIAGNOSIS — K5909 Other constipation: Secondary | ICD-10-CM | POA: Diagnosis not present

## 2023-01-05 MED ORDER — NA SULFATE-K SULFATE-MG SULF 17.5-3.13-1.6 GM/177ML PO SOLN: 1.0000 | ORAL | 0 refills | Status: DC

## 2023-01-05 NOTE — Patient Instructions (Signed)
We have given you samples of the following medication to take: Linzess 72 mcg and Linzess 290 mcg ( alternate as discussed with Dr.Pyrtle)   We have sent the following medications to your pharmacy for you to pick up at your convenience: [place] have been scheduled for a colonoscopy. Please follow written instructions given to you at your visit today.  Please pick up your prep supplies at the pharmacy within the next 1-3 days. If you use inhalers (even only as needed), please bring them with you on the day of your procedure.  _______________________________________________________  If your blood pressure at your visit was 140/90 or greater, please contact your primary care physician to follow up on this.  _______________________________________________________  If you are age 59 or older, your body mass index should be between 23-30. Your Body mass index is 27.98 kg/m. If this is out of the aforementioned range listed, please consider follow up with your Primary Care Provider.  If you are age 59 or younger, your body mass index should be between 19-25. Your Body mass index is 27.98 kg/m. If this is out of the aformentioned range listed, please consider follow up with your Primary Care Provider.   ________________________________________________________  The [facility] providers would like to encourage you to use [facility] to communicate with providers for non-urgent requests or questions.  Due to long hold times on the telephone, sending your provider a message by [facility] may be a faster and more efficient way to get a response.  Please allow 48 business hours for a response.  Please remember that this is for non-urgent requests.  _______________________________________________________  Thank you for choosing me and [facility].  Dr.Jay Pyrtle

## 2023-01-05 NOTE — Progress Notes (Signed)
Subjective:    Patient ID: Brian Estes, male    DOB: April 20, 1961, 62 y.o.   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  He is here alone today and was last seen on 04/15/2022.  He reports that he has had 3 kidney stones and these are quite painful to pass.  These require him to use narcotic pain medications and when this is necessary he has severe constipation, abdominal bloating and pain.  When he finally does have a bowel movement it has mucus and scant blood.  He uses Linzess 72 mcg daily which works very well unless he needs narcotic pain medication.  Outside of the pain medication which is very infrequent he is having daily bowel movements.  He will occasionally skip Linzess if he has something to do for a less predictable schedule.  He will occasionally see blood and mucus in stool but this is quite rare.  He denies upper GI and hepatobiliary complaint.  He is lost 9 pounds.  He has cut out sweet tea.  He is walking 3 miles a day.   Review of Systems As per HPI, otherwise negative  Current Medications, Allergies, Past Medical History, Past Surgical History, Family History and Social History were reviewed in [employer] record.    Objective:   Physical Exam BP 138/86   Pulse 65   Ht '5\' 10"'$  (1.778 m)   Wt 195 lb (88.5 kg)   SpO2 97%   BMI 27.98 kg/m  Gen: awake, alert, NAD HEENT: anicteric  Abd: soft, NT/ND, +BS throughout Ext: no c/c/e Neuro: nonfocal     Latest Ref Rng & Units 09/24/2022    4:02 AM 10/15/2021    9:28 AM 09/29/2021    9:48 AM  CBC  WBC 4.0 - 10.5 K/uL 8.6  7.9  9.3   Hemoglobin 13.0 - 17.0 g/dL 15.1  14.6  15.0   Hematocrit 39.0 - 52.0 % 43.8  42.4  43.9   Platelets 150 - 400 K/uL 381  319.0  385     CMP     Component Value Date/Time   NA 141 10/05/2022 0915   K 4.1 10/05/2022 0915   CL 105 10/05/2022 0915   CO2 22 10/05/2022 0915   GLUCOSE 93 10/05/2022 0915   GLUCOSE 110 (H) 09/24/2022 0402   BUN 17 10/05/2022 0915   CREATININE 0.89 10/05/2022 0915   CREATININE 0.87 05/16/2013 0859   CALCIUM 9.7 10/05/2022 0915   PROT 7.3 10/05/2022 0915   ALBUMIN 4.4 10/05/2022 0915   AST 20 10/05/2022 0915   ALT 25 10/05/2022 0915   ALKPHOS 96 10/05/2022 0915   BILITOT 0.4 10/05/2022 0915   GFRNONAA >60 09/24/2022 0402   GFRNONAA >89 05/16/2013 0859   GFRAA 96 03/25/2020 0847   GFRAA >89 05/16/2013 0859     CT ABDOMEN AND PELVIS WITHOUT CONTRAST   TECHNIQUE: Multidetector CT imaging of the abdomen and pelvis was performed following the standard protocol without IV contrast.   RADIATION DOSE REDUCTION: This exam was performed according to the departmental dose-optimization program which includes automated exposure control, adjustment of the mA and/or kV according to patient size and/or use of iterative reconstruction technique.   COMPARISON:  None available   FINDINGS: Lower chest: No acute finding.  Extensive coronary atheromatous calcification. Distended esophageal vestibule versus is tiny sliding hiatal hernia.   Hepatobiliary: Liver steatosis with pericholecystic sparing.No evidence of biliary obstruction or stone.   Pancreas: Unremarkable.   Spleen: Unremarkable.   Adrenals/Urinary Tract: Negative adrenals. Low-density left renal expansion with extensive perinephric stranding and mild hydronephrosis. Underlying proximal ureteral calculus measuring 4 mm at the level of the left flank. No additional nephrolithiasis. Unremarkable bladder.   Stomach/Bowel: Extensive sigmoid diverticulosis with innumerable diverticula and segmental wall thickening usually from muscular hypertrophy. Accentuated sigmoid mesenteric nodes. No bowel obstruction. Appendectomy.    Vascular/Lymphatic: Extensive atheromatous calcification. No mass or adenopathy.   Reproductive:No pathologic findings.   Other: No ascites or pneumoperitoneum.   Musculoskeletal: Advanced and generalized lumbar spine degeneration with mild scoliosis.   IMPRESSION: 1. Marked urinary obstructive findings on the left due to a proximal 4 mm ureteral calculus. 2. Severe diverticulosis of the sigmoid colon. Accentuated adjacent lymph nodes may be related to chronic inflammation, please ensure patient is up-to-date with colonoscopic screening. 3. Hepatic steatosis. 4. Atherosclerosis including extensive coronary calcification.     Electronically Signed   By: Jorje Guild M.D.   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  Segmental colitis associated with diverticulosis in the sigmoid/chronic constipation --we have discussed again his segmental colitis which is exacerbated by constipation.  His constipation is exacerbated with his infrequent narcotic use required while he has kidney stones.  We have discussed this in the past that he has not been interested in colitis therapy.  He would consider surgical resection if it was felt to be uncomplicated and without the need for ostomy.  All things considered I have recommended repeat direct visualization with colonoscopy.  We reviewed the risk, benefits and alternatives and he is agreeable and wishes to proceed -- Colonoscopy in the [facility]; patient request 8 AM appointment; consider ultraslim colonoscope -- Continue Linzess 72 mcg daily -- Increase Linzess to 290 mcg daily on days that he needs narcotic pain medications associated with kidney stones; samples provided of the 290 mcg dose given that this  will be infrequent  2.  Hepatic steatosis --previously elevated liver enzymes though with weight loss they have normalized.  Liver is normal otherwise by CT scan.  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  The question of AIH was raised but he has not wanted additional evaluation.  This should be considered if liver enzymes become elevated again in the future -- Routine liver enzyme monitoring  30 minutes total spent today including patient facing time, coordination of care, reviewing medical history/procedures/pertinent radiology studies, and documentation of the encounter.

## 2023-03-23 ENCOUNTER — Encounter: Payer: Self-pay | Admitting: Internal Medicine

## 2023-03-30 ENCOUNTER — Telehealth: Payer: Self-pay | Admitting: Internal Medicine

## 2023-03-30 NOTE — Telephone Encounter (Signed)
Spoke with pt and he is aware. 

## 2023-03-30 NOTE — Telephone Encounter (Signed)
Pt called and is scheduled for colon 5/24. Reports he stopped taking the linzess about 4 weeks ago because he thinks it was making him dizzy and dehydrated. He has changed his diet and come off of dairy and is not eating all the [place] food/chips that he was eating. Reports he is doing really well now and wonders if he need to have the colon. States he will do what Dr. Rhea Belton thinks he needs to but wonders if he really needs the colon since he is doing so well. Please advise.

## 2023-03-30 NOTE — Telephone Encounter (Signed)
I do think he should proceed with colonoscopy

## 2023-03-30 NOTE — Telephone Encounter (Signed)
PT has a colonoscopy scheduled for  5/24 and has some issues he wants to discuss. Requesting Call back

## 2023-04-02 ENCOUNTER — Ambulatory Visit (AMBULATORY_SURGERY_CENTER): Payer: BC Managed Care – PPO | Admitting: Internal Medicine

## 2023-04-02 ENCOUNTER — Encounter: Payer: Self-pay | Admitting: Internal Medicine

## 2023-04-02 VITALS — BP 112/71 | HR 61 | Temp 98.9°F | Resp 14 | Ht 70.0 in | Wt 195.0 lb

## 2023-04-02 DIAGNOSIS — K5010 Crohn's disease of large intestine without complications: Secondary | ICD-10-CM | POA: Diagnosis present

## 2023-04-02 DIAGNOSIS — K5900 Constipation, unspecified: Secondary | ICD-10-CM

## 2023-04-02 MED ORDER — SODIUM CHLORIDE 0.9 % IV SOLN: 500.0000 mL | Freq: Once | INTRAVENOUS | Status: DC

## 2023-04-02 NOTE — Op Note (Signed)
[facility] Patient Name: Brian Estes Procedure Date: 04/02/2023 7:20 AM MRN: [id number] Endoscopist: Beverley Fiedler , MD, [id number] Age: 62 Referring MD:  Date of Birth: [birth year] Gender: Male Account #: [id number] Procedure:                Colonoscopy Indications:              Segmental colitis, chronic constipation Medicines:                Monitored Anesthesia Care Procedure:                Pre-Anesthesia Assessment:                           - Prior to the procedure, a History and Physical                            was performed, and patient medications and                            allergies were reviewed. The patient's tolerance of                            previous anesthesia was also reviewed. The risks                            and benefits of the procedure and the sedation                            options and risks were discussed with the patient.                            All questions were answered, and informed consent                            was obtained. Prior Anticoagulants: The patient has                            taken no anticoagulant or antiplatelet agents. ASA                            Grade Assessment: II - A patient with mild systemic                            disease. After reviewing the risks and benefits,                            the patient was deemed in satisfactory condition to                            undergo the procedure.                           After obtaining informed consent, the colonoscope  was passed under direct vision. Throughout the                            procedure, the patient's blood pressure, pulse, and                            oxygen saturations were monitored continuously. The                            [id number]-[id number] Slim SN [id number] was introduced through                            the anus and advanced to the cecum, identified by                            palpation. The  colonoscopy was technically                            difficult and complex due to multiple diverticula                            in the colon and restricted mobility of the colon.                            Successful completion of the procedure was aided by                            applying abdominal pressure. The patient tolerated                            the procedure well. The quality of the bowel                            preparation was good. The ileocecal valve,                            appendiceal orifice, and rectum were photographed. Scope In: 8:12:41 AM Scope Out: 8:42:10 AM Scope Withdrawal Time: 0 hours 11 minutes 46 seconds  Total Procedure Duration: 0 hours 29 minutes 29 seconds  Findings:                 The digital rectal exam was normal.                           Many large-mouthed, medium-mouthed and                            small-mouthed diverticula were found in the sigmoid                            colon. There was narrowing of the colon in                            association with the diverticular opening. Petechia  were visualized in association with the                            diverticular opening.                           The rectum, descending colon, transverse colon,                            ascending colon and cecum appeared normal.                           Internal hemorrhoids were found during                            retroflexion. The hemorrhoids were small. Complications:            No immediate complications. Estimated Blood Loss:     Estimated blood loss: none. Impression:               - Severe diverticulosis in the sigmoid colon. There                            was narrowing of the colon in association with the                            diverticular opening. Petechia were visualized in                            association with the diverticular opening. Distal                            sigmoid  stricture.                           - The rectum, descending colon, transverse colon,                            ascending colon and cecum are normal.                           - Small internal hemorrhoids and otherwise the                            distal rectum and anal verge are normal on                            retroflexion view.                           - No specimens collected. Recommendation:           - Patient has a contact number available for                            emergencies. The signs and symptoms of potential  delayed complications were discussed with the                            patient. Return to normal activities tomorrow.                            Written discharge instructions were provided to the                            patient.                           - Resume previous diet.                           - Continue present medications.                           - Consider surgical referral for sigmoid resection.                           - Repeat colonoscopy in 5 years for screening                            purposes. Beverley Fiedler, MD 04/02/2023 8:53:19 AM This report has been signed electronically.

## 2023-04-02 NOTE — Progress Notes (Signed)
Vitals-CW  Pt's states no medical or surgical changes since previsit or office visit.Pt's states no medical or surgical changes since previsit or office visit.

## 2023-04-02 NOTE — Progress Notes (Signed)
GASTROENTEROLOGY PROCEDURE H&P NOTE   Primary Care Physician: Sonny Masters, FNP    Reason for Procedure:   Segmental colitis versus Crohn's  Plan:    colonoscopy  Patient is appropriate for endoscopic procedure(s) in the ambulatory ([facility]) setting.  The nature of the procedure, as well as the risks, benefits, and alternatives were carefully and thoroughly reviewed with the patient. Ample time for discussion and questions allowed. The patient understood, was satisfied, and agreed to proceed.     HPI: Brian Estes is a 62 y.o. male who presents for colonoscopy.  Medical history as below.  Tolerated the prep.  No recent chest pain or shortness of breath.  No abdominal pain today.  Past Medical History:  Diagnosis Date   Colitis    Diverticulosis of colon (without mention of hemorrhage)    Fatty liver    Hyperlipemia    Hypertension    Internal hemorrhoids    Kidney disease    Other chronic nonalcoholic liver disease    Regional enteritis of unspecified site    Vitamin B12 deficiency    Vitamin D deficiency     Past Surgical History:  Procedure Laterality Date   APPENDECTOMY  2001   INGUINAL HERNIA REPAIR  1976   KNEE SURGERY  1999   right    Prior to Admission medications   Medication Sig Start Date End Date Taking? Authorizing Provider  olmesartan (BENICAR) 40 MG tablet Take 1 tablet (40 mg total) by mouth daily. 10/05/22  Yes Hawks, Edilia Bo, FNP  linaclotide (LINZESS) 72 MCG capsule Take 1 capsule (72 mcg total) by mouth daily before breakfast. 04/15/22   Keelyn Fjelstad, Carie Caddy, MD    Current Outpatient Medications  Medication Sig Dispense Refill   olmesartan (BENICAR) 40 MG tablet Take 1 tablet (40 mg total) by mouth daily. 90 tablet 2   linaclotide (LINZESS) 72 MCG capsule Take 1 capsule (72 mcg total) by mouth daily before breakfast. 90 capsule 3   Current Facility-Administered Medications  Medication Dose Route Frequency Provider Last Rate Last Admin   0.9 %   sodium chloride infusion  500 mL Intravenous Once Jarrel Knoke, Carie Caddy, MD        Allergies as of 04/02/2023 - Review Complete 04/02/2023  Allergen Reaction Noted   Aspirin  05/21/2008   Lipitor [atorvastatin] Other (See Comments) 05/24/2013    Family History  Problem Relation Age of Onset   Heart disease Mother    Parkinson's disease Father    Colon cancer Neg Hx    Esophageal cancer Neg Hx    Stomach cancer Neg Hx    Pancreatic cancer Neg Hx     Social History   Socioeconomic History   Marital status: Married    Spouse name: Not on file   Number of children: 2   Years of education: Not on file   Highest education level: Not on file  Occupational History   Occupation: self employed  Tobacco Use   Smoking status: Never   Smokeless tobacco: Never  Vaping Use   Vaping Use: Never used  Substance and Sexual Activity   Alcohol use: Yes    Comment: rare   Drug use: No   Sexual activity: Not on file  Other Topics Concern   Not on file  Social History Narrative   Not on file   Social Determinants of Health   Financial Resource Strain: Not on file  Food Insecurity: Not on file  Transportation Needs: Not on  file  Physical Activity: Not on file  Stress: Not on file  Social Connections: Not on file  Intimate Partner Violence: Not on file    Physical Exam: Vital signs in last 24 hours: @BP  131/85 (BP Location: Right Arm, Patient Position: Sitting, Cuff Size: Normal)   Pulse 61   Temp 98.9 F (37.2 C) (Temporal)   Ht 5\' 10"  (1.778 m)   Wt 195 lb (88.5 kg)   SpO2 96%   BMI 27.98 kg/m  GEN: NAD EYE: Sclerae anicteric ENT: MMM CV: Non-tachycardic Pulm: CTA b/l GI: Soft, NT/ND NEURO:  Alert & Oriented x 3   Erick Blinks, MD [facility]  04/02/2023 8:06 AM

## 2023-04-02 NOTE — Progress Notes (Signed)
Sedate, gd SR, tolerated procedure well, VSS, report to RN 

## 2023-04-02 NOTE — Patient Instructions (Signed)
Thank you for coming in to see [place] today! Resume your diet and medications today. Consider surgical referral for sigmoid resections. Recommend repeat colonoscopy in 5 years.   YOU HAD AN ENDOSCOPIC PROCEDURE TODAY AT THE [facility]:   Refer to the procedure report that was given to you for any specific questions about what was found during the examination.  If the procedure report does not answer your questions, please call your gastroenterologist to clarify.  If you requested that your care partner not be given the details of your procedure findings, then the procedure report has been included in a sealed envelope for you to review at your convenience later.  YOU SHOULD EXPECT: Some feelings of bloating in the abdomen. Passage of more gas than usual.  Walking can help get rid of the air that was put into your GI tract during the procedure and reduce the bloating. If you had a lower endoscopy (such as a colonoscopy or flexible sigmoidoscopy) you may notice spotting of blood in your stool or on the toilet paper. If you underwent a bowel prep for your procedure, you may not have a normal bowel movement for a few days.  Please Note:  You might notice some irritation and congestion in your nose or some drainage.  This is from the oxygen used during your procedure.  There is no need for concern and it should clear up in a day or so.  SYMPTOMS TO REPORT IMMEDIATELY:  Following lower endoscopy (colonoscopy or flexible sigmoidoscopy):  Excessive amounts of blood in the stool  Significant tenderness or worsening of abdominal pains  Swelling of the abdomen that is new, acute  Fever of 100F or higher    For urgent or emergent issues, a gastroenterologist can be reached at any hour by calling [phone]. Do not use MyChart messaging for urgent concerns.    DIET:  We do recommend a small meal at first, but then you may proceed to your regular diet.  Drink plenty of fluids but you  should avoid alcoholic beverages for 24 hours.  ACTIVITY:  You should plan to take it easy for the rest of today and you should NOT DRIVE or use heavy machinery until tomorrow (because of the sedation medicines used during the test).    FOLLOW UP: Our staff will call the number listed on your records the next business day following your procedure.  We will call around 7:15- 8:00 am to check on you and address any questions or concerns that you may have regarding the information given to you following your procedure. If we do not reach you, we will leave a message.     If any biopsies were taken you will be contacted by phone or by letter within the next 1-3 weeks.  Please call us at [phone] if you have not heard about the biopsies in 3 weeks.    SIGNATURES/CONFIDENTIALITY: You and/or your care partner have signed paperwork which will be entered into your electronic medical record.  These signatures attest to the fact that that the information above on your After Visit Summary has been reviewed and is understood.  Full responsibility of the confidentiality of this discharge information lies with you and/or your care-partner.

## 2023-04-06 ENCOUNTER — Telehealth: Payer: Self-pay | Admitting: *Deleted

## 2023-04-06 NOTE — Telephone Encounter (Signed)
  Follow up Call-     04/02/2023    7:07 AM 04/02/2023    7:05 AM  Call back number  Post procedure Call Back phone  # [phone]   Permission to leave phone message  Yes     Patient questions:  Do you have a fever, pain , or abdominal swelling? No. Pain Score  0 *  Have you tolerated food without any problems? Yes.    Have you been able to return to your normal activities? Yes.    Do you have any questions about your discharge instructions: Diet   No. Medications  No. Follow up visit  No.  Do you have questions or concerns about your Care? No.  Actions: * If pain score is 4 or above: No action needed, pain <4.

## 2023-04-16 ENCOUNTER — Ambulatory Visit: Payer: BC Managed Care – PPO | Admitting: Family Medicine

## 2023-04-16 ENCOUNTER — Encounter: Payer: Self-pay | Admitting: Family Medicine

## 2023-04-16 VITALS — BP 127/75 | HR 64 | Temp 98.2°F | Ht 70.0 in | Wt 193.6 lb

## 2023-04-16 DIAGNOSIS — J392 Other diseases of pharynx: Secondary | ICD-10-CM | POA: Diagnosis not present

## 2023-04-16 DIAGNOSIS — J387 Other diseases of larynx: Secondary | ICD-10-CM | POA: Diagnosis not present

## 2023-04-16 MED ORDER — LIDOCAINE VISCOUS HCL 2 % MT SOLN: 15.0000 mL | OROMUCOSAL | 0 refills | Status: DC | PRN

## 2023-04-16 MED ORDER — AMOXICILLIN 875 MG PO TABS: 875.0000 mg | ORAL_TABLET | Freq: Two times a day (BID) | ORAL | 0 refills | Status: DC

## 2023-04-16 NOTE — Progress Notes (Signed)
Subjective:  Patient ID: Brian Estes, male    DOB: October 02, 1961, 62 y.o.   MRN: [id number]  Patient Care Team: Sonny Masters, FNP as PCP - General (Family Medicine)   Chief Complaint:  burnt throat (Patient states that he burnt his throat eating mac and cheese on Tuesday.  States his throat is still swollen but not painful. )   HPI: Brian Estes is a 62 y.o. male presenting on 04/16/2023 for burnt throat (Patient states that he burnt his throat eating mac and cheese on Tuesday.  States his throat is still swollen but not painful. )   Pt presents today for evaluation of throat pain, discoloration, and swelling after burning with hot mac and cheese. States he has been doing salt water gargles without relief of symptoms. Reports slowly improving but continues to be painful with swallowing. No fever, chills, weakness, throat swelling, confusion, or fatigue.      Relevant past medical, surgical, family, and social history reviewed and updated as indicated.  Allergies and medications reviewed and updated. Data reviewed: Chart in Epic.   Past Medical History:  Diagnosis Date   Colitis    Diverticulosis of colon (without mention of hemorrhage)    Fatty liver    Hyperlipemia    Hypertension    Internal hemorrhoids    Kidney disease    Other chronic nonalcoholic liver disease    Regional enteritis of unspecified site    Vitamin B12 deficiency    Vitamin D deficiency     Past Surgical History:  Procedure Laterality Date   APPENDECTOMY  2001   INGUINAL HERNIA REPAIR  1976   KNEE SURGERY  1999   right    Social History   Socioeconomic History   Marital status: Married    Spouse name: Not on file   Number of children: 2   Years of education: Not on file   Highest education level: Not on file  Occupational History   Occupation: self employed  Tobacco Use   Smoking status: Never   Smokeless tobacco: Never  Vaping Use   Vaping Use: Never used  Substance and Sexual  Activity   Alcohol use: Yes    Comment: rare   Drug use: No   Sexual activity: Not on file  Other Topics Concern   Not on file  Social History Narrative   Not on file   Social Determinants of Health   Financial Resource Strain: Not on file  Food Insecurity: Not on file  Transportation Needs: Not on file  Physical Activity: Not on file  Stress: Not on file  Social Connections: Not on file  Intimate Partner Violence: Not on file    Outpatient Encounter Medications as of 04/16/2023  Medication Sig   amoxicillin (AMOXIL) 875 MG tablet Take 1 tablet (875 mg total) by mouth 2 (two) times daily. 1 po BID   lidocaine (XYLOCAINE) 2 % solution Use as directed 15 mLs in the mouth or throat as needed for mouth pain.   linaclotide (LINZESS) 72 MCG capsule Take 1 capsule (72 mcg total) by mouth daily before breakfast.   olmesartan (BENICAR) 40 MG tablet Take 1 tablet (40 mg total) by mouth daily.   No facility-administered encounter medications on file as of 04/16/2023.    Allergies  Allergen Reactions   Aspirin    Lipitor [Atorvastatin] Other (See Comments)    myalgia    Review of Systems  Constitutional:  Negative for activity change, appetite  change, chills, diaphoresis, fatigue, fever and unexpected weight change.  HENT:  Positive for sore throat and trouble swallowing. Negative for congestion, dental problem, drooling, ear discharge, ear pain, facial swelling, hearing loss, mouth sores, nosebleeds, postnasal drip, rhinorrhea, sinus pressure, sinus pain, sneezing, tinnitus and voice change.   Eyes: Negative.   Respiratory:  Negative for cough, chest tightness and shortness of breath.   Cardiovascular:  Negative for chest pain, palpitations and leg swelling.  Gastrointestinal:  Negative for abdominal pain, blood in stool, constipation, diarrhea, nausea and vomiting.  Endocrine: Negative.   Genitourinary:  Negative for decreased urine volume, difficulty urinating, dysuria, frequency and  urgency.  Musculoskeletal:  Negative for arthralgias and myalgias.  Skin: Negative.   Allergic/Immunologic: Negative.   Neurological:  Negative for dizziness, tremors, seizures, syncope, facial asymmetry, speech difficulty, weakness, light-headedness, numbness and headaches.  Hematological: Negative.   Psychiatric/Behavioral:  Negative for confusion, hallucinations, sleep disturbance and suicidal ideas.   All other systems reviewed and are negative.       Objective:  BP 127/75 Comment: Bp at home  Pulse 64   Temp 98.2 F (36.8 C) (Temporal)   Ht 5\' 10"  (1.778 m)   Wt 193 lb 9.6 oz (87.8 kg)   SpO2 94%   BMI 27.78 kg/m    Wt Readings from Last 3 Encounters:  04/16/23 193 lb 9.6 oz (87.8 kg)  04/02/23 195 lb (88.5 kg)  01/05/23 195 lb (88.5 kg)    Physical Exam Vitals and nursing note reviewed.  Constitutional:      General: He is not in acute distress.    Appearance: Normal appearance. He is not ill-appearing, toxic-appearing or diaphoretic.  HENT:     Head: Normocephalic and atraumatic.     Nose: Nose normal.     Mouth/Throat:     Mouth: Mucous membranes are moist.     Pharynx: Pharyngeal swelling and posterior oropharyngeal erythema present. No oropharyngeal exudate or uvula swelling.     Tonsils: No tonsillar exudate or tonsillar abscesses.   Eyes:     Conjunctiva/sclera: Conjunctivae normal.     Pupils: Pupils are equal, round, and reactive to light.  Cardiovascular:     Rate and Rhythm: Normal rate.  Pulmonary:     Effort: Pulmonary effort is normal.  Skin:    General: Skin is warm and dry.     Capillary Refill: Capillary refill takes less than 2 seconds.  Neurological:     General: No focal deficit present.     Mental Status: He is alert and oriented to person, place, and time.  Psychiatric:        Mood and Affect: Mood normal.        Behavior: Behavior normal.        Thought Content: Thought content normal.        Judgment: Judgment normal.      Results for orders placed or performed in visit on 10/05/22  CMP14+EGFR  Result Value Ref Range   Glucose 93 70 - 99 mg/dL   BUN 17 8 - 27 mg/dL   Creatinine, Ser 1.61 0.76 - 1.27 mg/dL   eGFR 97 >09 UE/AVW/0.98   BUN/Creatinine Ratio 19 10 - 24   Sodium 141 134 - 144 mmol/L   Potassium 4.1 3.5 - 5.2 mmol/L   Chloride 105 96 - 106 mmol/L   CO2 22 20 - 29 mmol/L   Calcium 9.7 8.6 - 10.2 mg/dL   Total Protein 7.3 6.0 - 8.5 g/dL  Albumin 4.4 3.9 - 4.9 g/dL   Globulin, Total 2.9 1.5 - 4.5 g/dL   Albumin/Globulin Ratio 1.5 1.2 - 2.2   Bilirubin Total 0.4 0.0 - 1.2 mg/dL   Alkaline Phosphatase 96 44 - 121 IU/L   AST 20 0 - 40 IU/L   ALT 25 0 - 44 IU/L  PSA, total and free  Result Value Ref Range   Prostate Specific Ag, Serum 0.7 0.0 - 4.0 ng/mL   PSA, Free 0.23 N/A ng/mL   PSA, Free Pct 32.9 %  TSH  Result Value Ref Range   TSH 2.020 0.450 - 4.500 uIU/mL       Pertinent labs & imaging results that were available during my care of the patient were reviewed by me and considered in my medical decision making.  Assessment & Plan:  Rhea was seen today for burnt throat.  Diagnoses and all orders for this visit:  Throat irritation Ulcer of the throat Ulcer to left posterior pharynx with surrounding erythema. Start below. Report new, worsening, or persistent symptoms.  -     lidocaine (XYLOCAINE) 2 % solution; Use as directed 15 mLs in the mouth or throat as needed for mouth pain. -     amoxicillin (AMOXIL) 875 MG tablet; Take 1 tablet (875 mg total) by mouth 2 (two) times daily. 1 po BID     Continue all other maintenance medications.  Follow up plan: Return if symptoms worsen or fail to improve.   Continue healthy lifestyle choices, including diet (rich in fruits, vegetables, and lean proteins, and low in salt and simple carbohydrates) and exercise (at least 30 minutes of moderate physical activity daily).   The above assessment and management plan was discussed  with the patient. The patient verbalized understanding of and has agreed to the management plan. Patient is aware to call the clinic if they develop any new symptoms or if symptoms persist or worsen. Patient is aware when to return to the clinic for a follow-up visit. Patient educated on when it is appropriate to go to the emergency department.   Kari Baars, FNP-C [facility] [phone]

## 2023-05-19 ENCOUNTER — Telehealth: Payer: Self-pay | Admitting: Family Medicine

## 2023-05-19 NOTE — Telephone Encounter (Signed)
Appointment scheduled.

## 2023-05-19 NOTE — Telephone Encounter (Signed)
Please review

## 2023-05-19 NOTE — Telephone Encounter (Signed)
I would suggest making an appointment to be evaluated.

## 2023-05-21 ENCOUNTER — Ambulatory Visit: Payer: BC Managed Care – PPO | Admitting: Family Medicine

## 2023-07-23 ENCOUNTER — Other Ambulatory Visit: Payer: Self-pay | Admitting: Internal Medicine

## 2023-07-23 DIAGNOSIS — I10 Essential (primary) hypertension: Secondary | ICD-10-CM

## 2023-10-06 ENCOUNTER — Ambulatory Visit (INDEPENDENT_AMBULATORY_CARE_PROVIDER_SITE_OTHER): Payer: BC Managed Care – PPO | Admitting: Family Medicine

## 2023-10-06 ENCOUNTER — Encounter: Payer: Self-pay | Admitting: Family Medicine

## 2023-10-06 VITALS — BP 139/81 | HR 66 | Temp 97.4°F | Resp 20 | Ht 70.0 in | Wt 192.0 lb

## 2023-10-06 DIAGNOSIS — Z0001 Encounter for general adult medical examination with abnormal findings: Secondary | ICD-10-CM

## 2023-10-06 DIAGNOSIS — E782 Mixed hyperlipidemia: Secondary | ICD-10-CM

## 2023-10-06 DIAGNOSIS — Z0000 Encounter for general adult medical examination without abnormal findings: Secondary | ICD-10-CM

## 2023-10-06 DIAGNOSIS — E559 Vitamin D deficiency, unspecified: Secondary | ICD-10-CM | POA: Diagnosis not present

## 2023-10-06 DIAGNOSIS — E538 Deficiency of other specified B group vitamins: Secondary | ICD-10-CM

## 2023-10-06 DIAGNOSIS — J069 Acute upper respiratory infection, unspecified: Secondary | ICD-10-CM | POA: Diagnosis not present

## 2023-10-06 DIAGNOSIS — Z125 Encounter for screening for malignant neoplasm of prostate: Secondary | ICD-10-CM

## 2023-10-06 DIAGNOSIS — I10 Essential (primary) hypertension: Secondary | ICD-10-CM

## 2023-10-06 MED ORDER — OLMESARTAN MEDOXOMIL 40 MG PO TABS: 40.0000 mg | ORAL_TABLET | Freq: Every day | ORAL | 1 refills | Status: DC

## 2023-10-06 MED ORDER — AMOXICILLIN 875 MG PO TABS: 875.0000 mg | ORAL_TABLET | Freq: Two times a day (BID) | ORAL | 0 refills | Status: DC

## 2023-10-06 NOTE — Progress Notes (Signed)
Complete physical exam  Patient: Brian Estes   DOB: 1961-05-11   62 y.o. Male  MRN: [id number]  Subjective:    Chief Complaint  Patient presents with   Annual Exam    Brian Estes is a 62 y.o. male who presents today for a complete physical exam. He reports consuming a general diet. Home exercise routine includes walking 3 hrs per day. He generally feels well. He reports sleeping well. He does have additional problems to discuss today.   Discussed the use of AI scribe software for clinical note transcription with the patient, who gave verbal consent to proceed.  History of Present Illness   The patient, with a history of gastrointestinal issues, reports a recommendation for intestinal resection following a recent colonoscopy. They express hesitation about the procedure, citing concerns about recovery time and post-operative dietary restrictions. They also mention a history of fluctuating liver function tests, which they attribute to their gastrointestinal condition.  The patient has discontinued Linzess due to adverse effects, including dehydration and palpitations. They have replaced it with a daily teaspoon of olive oil, which they report has improved their bowel movements and is believed to have heart health benefits.  The patient reports a weight loss of approximately 15 pounds over the past year, which they attribute to daily three-mile walks and dietary changes. They deny eating fast food regularly and report eating three meals a day with no snacks in between.  The patient also mentions a chronic cough, which they do not associate with any particular illness. They report no changes in vision and use reading glasses as needed. They also report nocturia, waking up once a night around 3 am, which they state has been consistent for a while.  The patient has a history of adverse reactions to dietary supplements, including zinc, which they report caused abdominal swelling and  constipation. They express reluctance to try new supplements due to these past experiences.       Most recent fall risk assessment:    10/06/2023    8:02 AM  Fall Risk   Falls in the past year? 0  Follow up Falls evaluation completed     Most recent depression screenings:    10/06/2023    8:02 AM 04/16/2023    9:29 AM  PHQ 2/9 Scores  PHQ - 2 Score 0 0  PHQ- 9 Score 0 3    Vision:Within last year, Dental: No current dental problems and Receives regular dental care, and PSA: Prostate cancer screening and PSA options (with potential risks and benefits of testing vs. not testing) were discussed along with recent recs/guidelines.   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   B12 deficiency 05/24/2009   Hyperlipidemia 05/21/2008   Regional enteritis (HCC) 05/21/2008   Diverticulosis of colon 05/21/2008   Constipation 05/21/2008   Fatty liver 05/21/2008   Past Medical History:  Diagnosis Date   Colitis    Diverticulosis of colon (without mention of hemorrhage)    Fatty liver    Hyperlipemia    Hypertension    Internal hemorrhoids    Kidney disease    Other chronic nonalcoholic liver disease    Regional enteritis of unspecified site    Vitamin B12 deficiency  Vitamin D deficiency    Past Surgical History:  Procedure Laterality Date   APPENDECTOMY  2001   INGUINAL HERNIA REPAIR  1976   KNEE SURGERY  1999   right   Social History   Tobacco Use   Smoking status: Never   Smokeless tobacco: Never  Vaping Use   Vaping status: Never Used  Substance Use Topics   Alcohol use: Yes    Comment: rare   Drug use: No   Social History   Socioeconomic History   Marital status: Married    Spouse  name: Not on file   Number of children: 2   Years of education: Not on file   Highest education level: Not on file  Occupational History   Occupation: self employed  Tobacco Use   Smoking status: Never   Smokeless tobacco: Never  Vaping Use   Vaping status: Never Used  Substance and Sexual Activity   Alcohol use: Yes    Comment: rare   Drug use: No   Sexual activity: Not on file  Other Topics Concern   Not on file  Social History Narrative   Not on file   Social Determinants of Health   Financial Resource Strain: Not on file  Food Insecurity: Not on file  Transportation Needs: Not on file  Physical Activity: Not on file  Stress: Not on file  Social Connections: Not on file  Intimate Partner Violence: Not on file   Family Status  Relation Name Status   Mother  Alive   Father  Deceased   Neg Hx  (Not Specified)  No partnership data on file   Family History  Problem Relation Age of Onset   Heart disease Mother    Parkinson's disease Father    Colon cancer Neg Hx    Esophageal cancer Neg Hx    Stomach cancer Neg Hx    Pancreatic cancer Neg Hx    Allergies  Allergen Reactions   Aspirin    Lipitor [Atorvastatin] Other (See Comments)    myalgia      Patient Care Team: Sonny Masters, FNP as PCP - General (Family Medicine)   Outpatient Medications Prior to Visit  Medication Sig   [DISCONTINUED] amoxicillin (AMOXIL) 875 MG tablet Take 1 tablet (875 mg total) by mouth 2 (two) times daily. 1 po BID   [DISCONTINUED] lidocaine (XYLOCAINE) 2 % solution Use as directed 15 mLs in the mouth or throat as needed for mouth pain.   [DISCONTINUED] linaclotide (LINZESS) 72 MCG capsule Take 1 capsule (72 mcg total) by mouth daily before breakfast. (Patient not taking: Reported on 10/06/2023)   [DISCONTINUED] olmesartan (BENICAR) 40 MG tablet Take 1 tablet (40 mg total) by mouth daily.   No facility-administered medications prior to visit.    Review of Systems   Gastrointestinal:  Positive for abdominal pain, constipation and diarrhea. Negative for blood in stool, heartburn, melena, nausea and vomiting.  Genitourinary:        Nocturia once per night  All other systems reviewed and are negative.        Objective:     BP 139/81   Pulse 66   Temp (!) 97.4 F (36.3 C) (Oral)   Resp 20   Ht 5\' 10"  (1.778 m)   Wt 192 lb (87.1 kg)   SpO2 97%   BMI 27.55 kg/m  BP Readings from Last 3 Encounters:  10/06/23 139/81  04/16/23 127/75  04/02/23 112/71   Wt Readings from Last 3  Encounters:  10/06/23 192 lb (87.1 kg)  04/16/23 193 lb 9.6 oz (87.8 kg)  04/02/23 195 lb (88.5 kg)   SpO2 Readings from Last 3 Encounters:  10/06/23 97%  04/16/23 94%  04/02/23 98%      Physical Exam Vitals and nursing note reviewed.  Constitutional:      General: He is not in acute distress.    Appearance: Normal appearance. He is well-developed and well-groomed. He is not ill-appearing, toxic-appearing or diaphoretic.  HENT:     Head: Normocephalic and atraumatic.     Jaw: There is normal jaw occlusion.     Right Ear: Hearing normal.     Left Ear: Hearing normal.     Nose: Nose normal.     Mouth/Throat:     Lips: Pink.     Mouth: Mucous membranes are moist.     Pharynx: Oropharynx is clear. Uvula midline.  Eyes:     General: Lids are normal.     Extraocular Movements: Extraocular movements intact.     Conjunctiva/sclera: Conjunctivae normal.     Pupils: Pupils are equal, round, and reactive to light.  Neck:     Thyroid: No thyroid mass, thyromegaly or thyroid tenderness.     Vascular: No carotid bruit or JVD.     Trachea: Trachea and phonation normal.  Cardiovascular:     Rate and Rhythm: Normal rate and regular rhythm.     Chest Wall: PMI is not displaced.     Pulses: Normal pulses.     Heart sounds: Normal heart sounds. No murmur heard.    No friction rub. No gallop.  Pulmonary:     Effort: Pulmonary effort is normal. No respiratory  distress.     Breath sounds: Normal breath sounds. No wheezing.  Abdominal:     General: Bowel sounds are normal. There is no distension or abdominal bruit.     Palpations: Abdomen is soft. There is no hepatomegaly, splenomegaly or mass.     Tenderness: There is no abdominal tenderness. There is no right CVA tenderness, left CVA tenderness, guarding or rebound.     Hernia: No hernia is present.  Musculoskeletal:        General: Normal range of motion.     Cervical back: Normal range of motion and neck supple.     Right lower leg: No edema.     Left lower leg: No edema.  Lymphadenopathy:     Cervical: No cervical adenopathy.  Skin:    General: Skin is warm and dry.     Capillary Refill: Capillary refill takes less than 2 seconds.     Coloration: Skin is not cyanotic, jaundiced or pale.     Findings: No rash.  Neurological:     General: No focal deficit present.     Mental Status: He is alert and oriented to person, place, and time.     Sensory: Sensation is intact.     Motor: Motor function is intact.     Coordination: Coordination is intact.     Gait: Gait is intact.     Deep Tendon Reflexes: Reflexes are normal and symmetric.  Psychiatric:        Attention and Perception: Attention and perception normal.        Mood and Affect: Mood and affect normal.        Speech: Speech normal.        Behavior: Behavior normal. Behavior is cooperative.        Thought Content:  Thought content normal.        Cognition and Memory: Cognition and memory normal.        Judgment: Judgment normal.       Last CBC Lab Results  Component Value Date   WBC 8.6 09/24/2022   HGB 15.1 09/24/2022   HCT 43.8 09/24/2022   MCV 89.6 09/24/2022   MCH 30.9 09/24/2022   RDW 13.0 09/24/2022   PLT 381 09/24/2022   Last metabolic panel Lab Results  Component Value Date   GLUCOSE 93 10/05/2022   NA 141 10/05/2022   K 4.1 10/05/2022   CL 105 10/05/2022   CO2 22 10/05/2022   BUN 17 10/05/2022    CREATININE 0.89 10/05/2022   EGFR 97 10/05/2022   CALCIUM 9.7 10/05/2022   PROT 7.3 10/05/2022   ALBUMIN 4.4 10/05/2022   LABGLOB 2.9 10/05/2022   AGRATIO 1.5 10/05/2022   BILITOT 0.4 10/05/2022   ALKPHOS 96 10/05/2022   AST 20 10/05/2022   ALT 25 10/05/2022   ANIONGAP 10 09/24/2022   Last lipids Lab Results  Component Value Date   CHOL 165 09/29/2021   HDL 28 (L) 09/29/2021   LDLCALC 107 (H) 09/29/2021   TRIG 166 (H) 09/29/2021   CHOLHDL 5.9 (H) 09/29/2021   Last hemoglobin A1c No results found for: "HGBA1C" Last thyroid functions Lab Results  Component Value Date   TSH 2.020 10/05/2022   T4TOTAL 6.4 09/20/2019   Last vitamin D Lab Results  Component Value Date   VD25OH 18.6 (L) 09/29/2021   Last vitamin B12 and Folate Lab Results  Component Value Date   VITAMINB12 420 03/25/2020   FOLATE 8.4 05/23/2009        Assessment & Plan:    Routine Health Maintenance and Physical Exam  Immunization History  Administered Date(s) Administered   Influenza Split 08/04/2013   Influenza Whole 08/14/2009   Influenza,inj,Quad PF,6+ Mos 09/16/2015, 08/03/2016, 08/23/2017, 08/16/2018, 08/28/2019, 09/25/2020   Influenza-Unspecified 08/08/2014, 09/12/2021   Moderna Sars-Covid-2 Vaccination 12/25/2019, 01/22/2020, 10/08/2020   Tdap 02/15/2017   Zoster Recombinant(Shingrix) 03/25/2020, 06/11/2020    Health Maintenance  Topic Date Due   COVID-19 Vaccine (4 - 2023-24 season) 07/11/2023   INFLUENZA VACCINE  02/07/2024 (Originally 06/10/2023)   HIV Screening  10/05/2024 (Originally 07/14/1975)   DTaP/Tdap/Td (2 - Td or Tdap) 02/16/2027   Colonoscopy  04/01/2028   Hepatitis C Screening  Completed   Zoster Vaccines- Shingrix  Completed   HPV VACCINES  Aged Out    Discussed health benefits of physical activity, and encouraged him to engage in regular exercise appropriate for his age and condition.  Problem List Items Addressed This Visit       Cardiovascular and Mediastinum    Essential hypertension   Relevant Medications   olmesartan (BENICAR) 40 MG tablet   Other Relevant Orders   CBC with Differential/Platelet   CMP14+EGFR   Lipid panel   Thyroid Panel With TSH     Other   B12 deficiency   Relevant Orders   CBC with Differential/Platelet   Vitamin B12   Hyperlipidemia   Relevant Medications   olmesartan (BENICAR) 40 MG tablet   Other Relevant Orders   CMP14+EGFR   Lipid panel   Vitamin D deficiency   Relevant Orders   CMP14+EGFR   VITAMIN D 25 Hydroxy (Vit-D Deficiency, Fractures)   Other Visit Diagnoses     Annual physical exam    -  Primary   Relevant Orders   CBC with Differential/Platelet  CMP14+EGFR   Lipid panel   Thyroid Panel With TSH   PSA, total and free   Screening for prostate cancer       Relevant Orders   PSA, total and free   URI with cough and congestion       Relevant Medications   amoxicillin (AMOXIL) 875 MG tablet     Assessment and Plan    Chronic Abdominal Pain Chronic abdominal pain likely related to gastrointestinal issues. Hesitant about partial intestinal resection; seeking more information on procedure, recovery, and outcomes. Concerns about downtime, dietary restrictions, and caregiving responsibilities. - Discuss potential benefits and risks of intestinal resection surgery, including recovery time and dietary restrictions. - Encourage scheduling an appointment with a surgeon to discuss details of the surgery.  Hypertension Hypertension well-controlled with olmesartan. No issues with medication. Continues to walk three miles daily. - Refill olmesartan prescription.  Hyperlipidemia Hyperlipidemia, intolerant to statins. Made dietary changes and exercises regularly. Hesitant to take supplements due to adverse reactions. - Order lipid panel to assess LDL and total cholesterol levels. - Discuss potential over-the-counter options like red yeast rice if cholesterol levels remain high. - Consider Benefiber  as an alternative to help manage cholesterol.  General Health Maintenance Generally in good health, walks three miles daily, and has lost weight over the past year. No recent eye exam. - Encourage regular eye exams. - Continue current exercise and dietary habits.  Follow-up - Order liver function tests. - Schedule follow-up appointment to review lab results and discuss next steps.       Return if symptoms worsen or fail to improve.     Kari Baars, FNP

## 2023-10-06 NOTE — Patient Instructions (Signed)
Red Yeast Rice 2400 mg daily for high cholesterol.

## 2023-10-07 LAB — CBC WITH DIFFERENTIAL/PLATELET
Basophils Absolute: 0.1 10*3/uL (ref 0.0–0.2)
Basos: 1 %
EOS (ABSOLUTE): 0.2 10*3/uL (ref 0.0–0.4)
Eos: 2 %
Hematocrit: 43.4 % (ref 37.5–51.0)
Hemoglobin: 14.1 g/dL (ref 13.0–17.7)
Immature Grans (Abs): 0 10*3/uL (ref 0.0–0.1)
Immature Granulocytes: 0 %
Lymphocytes Absolute: 2.1 10*3/uL (ref 0.7–3.1)
Lymphs: 28 %
MCH: 30.7 pg (ref 26.6–33.0)
MCHC: 32.5 g/dL (ref 31.5–35.7)
MCV: 95 fL (ref 79–97)
Monocytes Absolute: 0.9 10*3/uL (ref 0.1–0.9)
Monocytes: 12 %
Neutrophils Absolute: 4.3 10*3/uL (ref 1.4–7.0)
Neutrophils: 57 %
Platelets: 348 10*3/uL (ref 150–450)
RBC: 4.59 x10E6/uL (ref 4.14–5.80)
RDW: 12.4 % (ref 11.6–15.4)
WBC: 7.5 10*3/uL (ref 3.4–10.8)

## 2023-10-07 LAB — CMP14+EGFR
ALT: 20 [IU]/L (ref 0–44)
AST: 23 [IU]/L (ref 0–40)
Albumin: 4.2 g/dL (ref 3.9–4.9)
Alkaline Phosphatase: 103 [IU]/L (ref 44–121)
BUN/Creatinine Ratio: 18 (ref 10–24)
BUN: 17 mg/dL (ref 8–27)
Bilirubin Total: 0.5 mg/dL (ref 0.0–1.2)
CO2: 24 mmol/L (ref 20–29)
Calcium: 9.7 mg/dL (ref 8.6–10.2)
Chloride: 102 mmol/L (ref 96–106)
Creatinine, Ser: 0.94 mg/dL (ref 0.76–1.27)
Globulin, Total: 3.5 g/dL (ref 1.5–4.5)
Glucose: 92 mg/dL (ref 70–99)
Potassium: 4.2 mmol/L (ref 3.5–5.2)
Sodium: 139 mmol/L (ref 134–144)
Total Protein: 7.7 g/dL (ref 6.0–8.5)
eGFR: 91 mL/min/{1.73_m2} (ref >59)

## 2023-10-07 LAB — PSA, TOTAL AND FREE
PSA, Free Pct: 27 %
PSA, Free: 0.27 ng/mL
Prostate Specific Ag, Serum: 1 ng/mL (ref 0.0–4.0)

## 2023-10-07 LAB — VITAMIN B12: Vitamin B-12: 162 pg/mL — ABNORMAL LOW (ref 232–1245)

## 2023-10-07 LAB — THYROID PANEL WITH TSH
Free Thyroxine Index: 2 (ref 1.2–4.9)
T3 Uptake Ratio: 26 % (ref 24–39)
T4, Total: 7.6 ug/dL (ref 4.5–12.0)
TSH: 2.65 u[IU]/mL (ref 0.450–4.500)

## 2023-10-07 LAB — LIPID PANEL
Cholesterol, Total: 210 mg/dL — ABNORMAL HIGH (ref 100–199)
HDL: 32 mg/dL — ABNORMAL LOW (ref >39)
LDL CALC COMMENT:: 6.6 ratio — ABNORMAL HIGH (ref 0.0–5.0)
LDL Chol Calc (NIH): 147 mg/dL — ABNORMAL HIGH (ref 0–99)
Triglycerides: 167 mg/dL — ABNORMAL HIGH (ref 0–149)
VLDL Cholesterol Cal: 31 mg/dL (ref 5–40)

## 2023-10-07 LAB — VITAMIN D 25 HYDROXY (VIT D DEFICIENCY, FRACTURES): Vit D, 25-Hydroxy: 19.8 ng/mL — ABNORMAL LOW (ref 30.0–100.0)

## 2023-11-19 ENCOUNTER — Ambulatory Visit: Payer: 59 | Admitting: Internal Medicine

## 2023-11-19 ENCOUNTER — Encounter: Payer: Self-pay | Admitting: Internal Medicine

## 2023-11-19 VITALS — BP 124/70 | HR 72 | Ht 70.0 in | Wt 192.0 lb

## 2023-11-19 DIAGNOSIS — K760 Fatty (change of) liver, not elsewhere classified: Secondary | ICD-10-CM

## 2023-11-19 DIAGNOSIS — R1032 Left lower quadrant pain: Secondary | ICD-10-CM | POA: Diagnosis not present

## 2023-11-19 DIAGNOSIS — K5730 Diverticulosis of large intestine without perforation or abscess without bleeding: Secondary | ICD-10-CM

## 2023-11-19 DIAGNOSIS — Z8719 Personal history of other diseases of the digestive system: Secondary | ICD-10-CM

## 2023-11-19 DIAGNOSIS — K5900 Constipation, unspecified: Secondary | ICD-10-CM

## 2023-11-19 DIAGNOSIS — K5909 Other constipation: Secondary | ICD-10-CM

## 2023-11-19 DIAGNOSIS — E538 Deficiency of other specified B group vitamins: Secondary | ICD-10-CM

## 2023-11-19 DIAGNOSIS — K5010 Crohn's disease of large intestine without complications: Secondary | ICD-10-CM

## 2023-11-19 MED ORDER — LINACLOTIDE 72 MCG PO CAPS: 72.0000 ug | ORAL_CAPSULE | Freq: Every day | ORAL | 3 refills | Status: AC

## 2023-11-19 NOTE — Progress Notes (Signed)
 Subjective:    Patient ID: Brian Estes, male    DOB: 03-29-1961, 63 y.o.   MRN: [id number]  HPI Brian Estes is a 63 year old male with a history of segmental colitis associated with diverticulosis, severe and symptomatic diverticular disease with constipation from sigmoid diverticulosis, prior fatty liver, hypertension and hyperlipidemia, kidney stones who is here for follow-up.  Was last seen in the office in February 2024 and for colonoscopy on 04/02/2023.  He is here alone today.  Colonoscopy on 04/02/2023 showed many large, medium and small mouth diverticula in the sigmoid with narrowing of the diverticular opening.  There was petechiae present but no significant inflammation.  The remainder of the exam was normal.  A ultraslim colonoscope was needed to traverse the sigmoid.  At that time we had talked about consideration of sigmoidectomy for symptomatic diverticular disease but he never received the consult from surgery which we had talked about.  He reports on the whole he is doing fairly well he has a hard time with his initial bowel movement each day but it gets better throughout the day.  He still needs Linzess  1 or 2 times a week but when taking daily he found it to be somewhat dehydrating.  He is very active walking 3 miles a day on hilly terrain.  No dyspnea or chest pain.  He tries to eat a very high vegetable diet.  He is open to consideration of sigmoid resection particularly if this condition is not expected to improve or worsen as he ages.  He is not having any issues with upper abdominal pain, indigestion or trouble swallowing.  His only medication is olmesartan .  He remains very active and enjoys going to horse shows and also playing golf.   Review of Systems As per HPI, otherwise negative  Current Medications, Allergies, Past Medical History, Past Surgical History, Family History and Social History were reviewed in [employer] record.    Objective:    Physical Exam BP 124/70   Pulse 72   Ht 5' 10 (1.778 m)   Wt 192 lb (87.1 kg)   BMI 27.55 kg/m  Gen: awake, alert, NAD HEENT: anicteric  Abd: soft, NT/ND, +BS throughout Ext: no c/c/e Neuro: nonfocal  CT ABDOMEN AND PELVIS WITHOUT CONTRAST   TECHNIQUE: Multidetector CT imaging of the abdomen and pelvis was performed following the standard protocol without IV contrast.   RADIATION DOSE REDUCTION: This exam was performed according to the departmental dose-optimization program which includes automated exposure control, adjustment of the mA and/or kV according to patient size and/or use of iterative reconstruction technique.   COMPARISON:  None available   FINDINGS: Lower chest: No acute finding. Extensive coronary atheromatous calcification. Distended esophageal vestibule versus is tiny sliding hiatal hernia.   Hepatobiliary: Liver steatosis with pericholecystic sparing.No evidence of biliary obstruction or stone.   Pancreas: Unremarkable.   Spleen: Unremarkable.   Adrenals/Urinary Tract: Negative adrenals. Low-density left renal expansion with extensive perinephric stranding and mild hydronephrosis. Underlying proximal ureteral calculus measuring 4 mm at the level of the left flank. No additional nephrolithiasis. Unremarkable bladder.   Stomach/Bowel: Extensive sigmoid diverticulosis with innumerable diverticula and segmental wall thickening usually from muscular hypertrophy. Accentuated sigmoid mesenteric nodes. No bowel obstruction. Appendectomy.   Vascular/Lymphatic: Extensive atheromatous calcification. No mass or adenopathy.   Reproductive:No pathologic findings.   Other: No ascites or pneumoperitoneum.   Musculoskeletal: Advanced and generalized lumbar spine degeneration with mild scoliosis.   IMPRESSION: 1. Marked urinary obstructive findings on  the left due to a proximal 4 mm ureteral calculus. 2. Severe diverticulosis of the sigmoid colon.  Accentuated adjacent lymph nodes may be related to chronic inflammation, please ensure patient is up-to-date with colonoscopic screening. 3. Hepatic steatosis. 4. Atherosclerosis including extensive coronary calcification.     Electronically Signed   By: Dorn Roulette M.D.   On: 09/24/2022 05:10      Assessment & Plan:  64 year old male with a history of segmental colitis associated with diverticulosis, severe and symptomatic diverticular disease with constipation from sigmoid diverticulosis, prior fatty liver, hypertension and hyperlipidemia, kidney stones who is here for follow-up.   Symptomatic sigmoid diverticular disease with history of SCAD and constipation --I do think he would benefit tremendously from sigmoid resection for symptomatic diverticular disease.  He has had prior significant inflammation though better at last colonoscopy.  He has issues with constipation and lower abdominal discomfort.  This is not expected to improve as he ages and he would be at risk for complications such as diverticulitis and stricturing. -- Referral to [facility] for a visit with Dr. Debby or Dr. Teresa to discuss elective laparoscopic sigmoid resection with primary anastomosis -- In the interim continue Linzess  72 mcg as needed  2.  Hepatic steatosis --normal liver enzymes with no evidence for advanced liver disease or cirrhosis. -Continue healthy diet and lifestyle  3.  Low B12 --B12 supplementation 1000 mcg daily  30 minutes total spent today including patient facing time, coordination of care, reviewing medical history/procedures/pertinent radiology studies, and documentation of the encounter.

## 2023-11-19 NOTE — Patient Instructions (Signed)
 We have printed the following medications for you to take to your pharmacy: Linzess  72 mcg daily. Samples have been given to patient.  You have been scheduled for an appointment with ______________ at [facility]. Your appointment is on ___________ at __________. Please arrive at __________ for registration. Make certain to bring a list of current medications, including any over the counter medications or vitamins. Also bring your co-pay if you have one as well as your insurance cards. [facility] is located at [address]. Should you need to reschedule your appointment, please contact them at [phone].  _______________________________________________________  If your blood pressure at your visit was 140/90 or greater, please contact your primary care physician to follow up on this.  _______________________________________________________  If you are age 85 or older, your body mass index should be between 23-30. Your Body mass index is 27.55 kg/m. If this is out of the aforementioned range listed, please consider follow up with your Primary Care Provider.  If you are age 50 or younger, your body mass index should be between 19-25. Your Body mass index is 27.55 kg/m. If this is out of the aformentioned range listed, please consider follow up with your Primary Care Provider.   ________________________________________________________  The [facility] providers would like to encourage you to use MYCHART to communicate with providers for non-urgent requests or questions.  Due to long hold times on the telephone, sending your provider a message by [facility] may be a faster and more efficient way to get a response.  Please allow 48 business hours for a response.  Please remember that this is for non-urgent requests.  _______________________________________________________

## 2023-12-22 NOTE — Progress Notes (Signed)
 REFERRING PHYSICIAN:  Pyrtle, Gordy Sayre, MD  PROVIDER:  BERNARDA WANDA NED, MD  MRN: [id number] DOB: 06-27-1961 DATE OF ENCOUNTER: 12/22/2023  Subjective   Chief Complaint: New Consultation (Diverticulosis/segmental colitis)     History of Present Illness: Brian Estes is a 63 y.o. male who is seen today as an office consultation at the request of Dr. Albertus for evaluation of New Consultation (Diverticulosis/segmental colitis) .  48 male with chronic constipation who presents to the office for evaluation of a diverticular stricture which was seen on colonoscopy in May 2024.  Patient has a history of colitis and has undergone regular colonoscopies every 3 to 5 years.  He reports that he has been on Linzess  and has minimal symptoms at this time.  As best I can tell, he has not had any complicated episodes of diverticulitis.   Review of Systems: A complete review of systems was obtained from the patient.  I have reviewed this information and discussed as appropriate with the patient.  See HPI as well for other ROS.   Medical History: Past Medical History:  Diagnosis Date  . Hypertension     Patient Active Problem List  Diagnosis  . Diverticulosis of colon  . Lichen simplex  . Segmental colitis (CMS/HHS-HCC)  . Regional enteritis (CMS/HHS-HCC)    Past Surgical History:  Procedure Laterality Date  . APPENDECTOMY    . HERNIA REPAIR    . JOINT REPLACEMENT       No Known Allergies  Current Outpatient Medications on File Prior to Visit  Medication Sig Dispense Refill  . linaCLOtide  (LINZESS ) 72 mcg capsule Take 72 mcg by mouth every morning before breakfast (0630)    . olmesartan  (BENICAR ) 40 MG tablet Take 40 mg by mouth once daily     No current facility-administered medications on file prior to visit.    Family History  Problem Relation Age of Onset  . High blood pressure (Hypertension) Mother   . Hyperlipidemia (Elevated cholesterol) Mother      Social  History   Tobacco Use  Smoking Status Never  Smokeless Tobacco Never     Social History   Socioeconomic History  . Marital status: Married  Tobacco Use  . Smoking status: Never  . Smokeless tobacco: Never  Vaping Use  . Vaping status: Never Used  Substance and Sexual Activity  . Alcohol use: Not Currently  . Drug use: Never   Social Drivers of Health   Housing Stability: Unknown (12/22/2023)   Housing Stability Vital Sign   . Homeless in the Last Year: No    Objective:    Vitals:   12/22/23 1055  BP: 124/68  Pulse: 64  Temp: 36.8 C (98.2 F)  SpO2: 98%  Weight: 87.1 kg (192 lb)  Height: 176.5 cm (5' 9.5)     Exam Gen: NAD Abd: soft, NT    Labs, Imaging and Diagnostic Testing: CT and recent colonoscopy reports reviewed  Assessment and Plan:  Diagnoses and all orders for this visit:  Diverticular stricture (CMS/HHS-HCC)    Patient with several episodes of recurrent diverticulitis and diverticular stricture on most recent colonoscopy.  We discussed that surgical treatment is up to him.  If he is getting good relief of his symptoms with medical treatment, he is welcome to continue that.  We discussed the surgery in detail including risk associated with the operation and time needed for healing.  After this discussion, he decided that he would like to continue with medical management  at this time.  Return if symptoms worsen or fail to improve.    Bernarda JAYSON Ned, MD Colon and Rectal Surgery [facility]

## 2024-01-21 ENCOUNTER — Telehealth: Payer: Self-pay

## 2024-01-21 NOTE — Telephone Encounter (Signed)
 Copied from CRM [id number]. Topic: Clinical - Medication Question >> Jan 21, 2024  2:46 PM Brian Estes wrote: Reason for CRM: Patient is stating he has a sinus infection. He would like a prescription for an antibiotic. Has facial pressure and no cold or fever.

## 2024-01-24 NOTE — Telephone Encounter (Signed)
 Lmtcb to schedule an apt

## 2024-01-27 ENCOUNTER — Encounter: Payer: Self-pay | Admitting: Family Medicine

## 2024-01-27 ENCOUNTER — Ambulatory Visit: Admitting: Family Medicine

## 2024-01-27 VITALS — BP 114/78 | HR 73 | Temp 97.3°F | Ht 70.0 in | Wt 197.0 lb

## 2024-01-27 DIAGNOSIS — K5010 Crohn's disease of large intestine without complications: Secondary | ICD-10-CM

## 2024-01-27 DIAGNOSIS — J0140 Acute pansinusitis, unspecified: Secondary | ICD-10-CM

## 2024-01-27 MED ORDER — METHYLPREDNISOLONE ACETATE 80 MG/ML IJ SUSP
80.0000 mg | Freq: Once | INTRAMUSCULAR | Status: AC
  Administered 2024-01-27: 80 mg via INTRAMUSCULAR

## 2024-01-27 MED ORDER — AMOXICILLIN-POT CLAVULANATE 875-125 MG PO TABS: 1.0000 | ORAL_TABLET | Freq: Two times a day (BID) | ORAL | 0 refills | Status: AC

## 2024-01-27 NOTE — Progress Notes (Signed)
 Subjective:  Patient ID: Brian Estes, male    DOB: Jan 20, 1961, 63 y.o.   MRN: [id number]  Patient Care Team: Sonny Masters, FNP as PCP - General (Family Medicine)   Chief Complaint:  Nasal Congestion and Facial Pain (X 1 week )   HPI: Brian Estes is a 63 y.o. male presenting on 01/27/2024 for Nasal Congestion and Facial Pain (X 1 week )   Discussed the use of AI scribe software for clinical note transcription with the patient, who gave verbal consent to proceed.  History of Present Illness   Brian Estes "Brian Estes" is a 63 year old male with Crohn's disease who presents with symptoms of a sinus infection.  He has been experiencing sinus pressure and congestion for over a week, without fever or cough. He describes thick nasal discharge and a watery sensation in his ears. No significant dizziness or dental pain, but there is postnasal drainage and sinus pressure primarily in his cheeks.  He has been managing his symptoms with Sudafed and Tylenol. He has not used Flonase due to adverse effects experienced in the past.  He has a history of Crohn's disease and recalls receiving a Depo-Medrol injection previously, which he found beneficial for both his sinus and Crohn's symptoms.          Relevant past medical, surgical, family, and social history reviewed and updated as indicated.  Allergies and medications reviewed and updated. Data reviewed: Chart in Epic.   Past Medical History:  Diagnosis Date   Colitis    Diverticulosis of colon (without mention of hemorrhage)    Fatty liver    Hyperlipemia    Hypertension    Internal hemorrhoids    Kidney disease    Other chronic nonalcoholic liver disease    Regional enteritis of unspecified site    Vitamin B12 deficiency    Vitamin D deficiency     Past Surgical History:  Procedure Laterality Date   APPENDECTOMY  2001   INGUINAL HERNIA REPAIR  1976   KNEE SURGERY  1999   right    Social History   Socioeconomic History    Marital status: Married    Spouse name: Not on file   Number of children: 2   Years of education: Not on file   Highest education level: Not on file  Occupational History   Occupation: self employed-retired  Tobacco Use   Smoking status: Never   Smokeless tobacco: Never  Vaping Use   Vaping status: Never Used  Substance and Sexual Activity   Alcohol use: Yes    Comment: rare   Drug use: No   Sexual activity: Not on file  Other Topics Concern   Not on file  Social History Narrative   Not on file   Social Drivers of Health   Financial Resource Strain: Not on file  Food Insecurity: Not on file  Transportation Needs: Not on file  Physical Activity: Not on file  Stress: Not on file  Social Connections: Not on file  Intimate Partner Violence: Not on file    Outpatient Encounter Medications as of 01/27/2024  Medication Sig   amoxicillin-clavulanate (AUGMENTIN) 875-125 MG tablet Take 1 tablet by mouth 2 (two) times daily for 7 days.   linaclotide (LINZESS) 72 MCG capsule Take 1 capsule (72 mcg total) by mouth daily before breakfast.   olmesartan (BENICAR) 40 MG tablet Take 1 tablet (40 mg total) by mouth daily.   No facility-administered encounter medications  on file as of 01/27/2024.    Allergies  Allergen Reactions   Aspirin    Lipitor [Atorvastatin] Other (See Comments)    myalgia    Pertinent ROS per HPI, otherwise unremarkable      Objective:  BP 114/78   Pulse 73   Temp (!) 97.3 F (36.3 C)   Ht 5\' 10"  (1.778 m)   Wt 197 lb (89.4 kg)   SpO2 93%   BMI 28.27 kg/m    Wt Readings from Last 3 Encounters:  01/27/24 197 lb (89.4 kg)  11/19/23 192 lb (87.1 kg)  10/06/23 192 lb (87.1 kg)    Physical Exam Vitals and nursing note reviewed.  Constitutional:      Appearance: Normal appearance. He is well-developed, well-groomed and overweight.  HENT:     Head: Normocephalic and atraumatic.     Jaw: There is normal jaw occlusion.     Right Ear: A middle  ear effusion is present.     Left Ear: A middle ear effusion is present.     Nose:     Right Turbinates: Enlarged.     Left Turbinates: Enlarged.     Right Sinus: Maxillary sinus tenderness and frontal sinus tenderness present.     Left Sinus: Maxillary sinus tenderness and frontal sinus tenderness present.     Mouth/Throat:     Lips: Pink.     Mouth: Mucous membranes are moist.     Pharynx: Posterior oropharyngeal erythema and postnasal drip present. No pharyngeal swelling, oropharyngeal exudate or uvula swelling.  Eyes:     Conjunctiva/sclera: Conjunctivae normal.     Pupils: Pupils are equal, round, and reactive to light.  Cardiovascular:     Rate and Rhythm: Normal rate and regular rhythm.     Heart sounds: Normal heart sounds.  Pulmonary:     Effort: Pulmonary effort is normal.     Breath sounds: Normal breath sounds.  Musculoskeletal:     Cervical back: Neck supple.  Lymphadenopathy:     Cervical: No cervical adenopathy.  Skin:    General: Skin is warm and dry.     Capillary Refill: Capillary refill takes less than 2 seconds.  Neurological:     General: No focal deficit present.     Mental Status: He is alert and oriented to person, place, and time.  Psychiatric:        Mood and Affect: Mood normal.        Behavior: Behavior normal. Behavior is cooperative.        Thought Content: Thought content normal.        Judgment: Judgment normal.       Results for orders placed or performed in visit on 10/06/23  CBC with Differential/Platelet   Collection Time: 10/06/23  8:20 AM  Result Value Ref Range   WBC 7.5 3.4 - 10.8 x10E3/uL   RBC 4.59 4.14 - 5.80 x10E6/uL   Hemoglobin 14.1 13.0 - 17.7 g/dL   Hematocrit 16.1 09.6 - 51.0 %   MCV 95 79 - 97 fL   MCH 30.7 26.6 - 33.0 pg   MCHC 32.5 31.5 - 35.7 g/dL   RDW 04.5 40.9 - 81.1 %   Platelets 348 150 - 450 x10E3/uL   Neutrophils 57 Not Estab. %   Lymphs 28 Not Estab. %   Monocytes 12 Not Estab. %   Eos 2 Not Estab. %    Basos 1 Not Estab. %   Neutrophils Absolute 4.3 1.4 - 7.0 x10E3/uL  Lymphocytes Absolute 2.1 0.7 - 3.1 x10E3/uL   Monocytes Absolute 0.9 0.1 - 0.9 x10E3/uL   EOS (ABSOLUTE) 0.2 0.0 - 0.4 x10E3/uL   Basophils Absolute 0.1 0.0 - 0.2 x10E3/uL   Immature Granulocytes 0 Not Estab. %   Immature Grans (Abs) 0.0 0.0 - 0.1 x10E3/uL  CMP14+EGFR   Collection Time: 10/06/23  8:20 AM  Result Value Ref Range   Glucose 92 70 - 99 mg/dL   BUN 17 8 - 27 mg/dL   Creatinine, Ser 6.96 0.76 - 1.27 mg/dL   eGFR 91 >29 BM/WUX/3.24   BUN/Creatinine Ratio 18 10 - 24   Sodium 139 134 - 144 mmol/L   Potassium 4.2 3.5 - 5.2 mmol/L   Chloride 102 96 - 106 mmol/L   CO2 24 20 - 29 mmol/L   Calcium 9.7 8.6 - 10.2 mg/dL   Total Protein 7.7 6.0 - 8.5 g/dL   Albumin 4.2 3.9 - 4.9 g/dL   Globulin, Total 3.5 1.5 - 4.5 g/dL   Bilirubin Total 0.5 0.0 - 1.2 mg/dL   Alkaline Phosphatase 103 44 - 121 IU/L   AST 23 0 - 40 IU/L   ALT 20 0 - 44 IU/L  Lipid panel   Collection Time: 10/06/23  8:20 AM  Result Value Ref Range   Cholesterol, Total 210 (H) 100 - 199 mg/dL   Triglycerides 401 (H) 0 - 149 mg/dL   HDL 32 (L) >02 mg/dL   VLDL Cholesterol Cal 31 5 - 40 mg/dL   LDL Chol Calc (NIH) 725 (H) 0 - 99 mg/dL   Chol/HDL Ratio 6.6 (H) 0.0 - 5.0 ratio  Thyroid Panel With TSH   Collection Time: 10/06/23  8:20 AM  Result Value Ref Range   TSH 2.650 0.450 - 4.500 uIU/mL   T4, Total 7.6 4.5 - 12.0 ug/dL   T3 Uptake Ratio 26 24 - 39 %   Free Thyroxine Index 2.0 1.2 - 4.9  PSA, total and free   Collection Time: 10/06/23  8:20 AM  Result Value Ref Range   Prostate Specific Ag, Serum 1.0 0.0 - 4.0 ng/mL   PSA, Free 0.27 N/A ng/mL   PSA, Free Pct 27.0 %  Vitamin B12   Collection Time: 10/06/23  8:20 AM  Result Value Ref Range   Vitamin B-12 162 (L) 232 - 1,245 pg/mL  VITAMIN D 25 Hydroxy (Vit-D Deficiency, Fractures)   Collection Time: 10/06/23  8:20 AM  Result Value Ref Range   Vit D, 25-Hydroxy 19.8 (L) 30.0 -  100.0 ng/mL       Pertinent labs & imaging results that were available during my care of the patient were reviewed by me and considered in my medical decision making.  Assessment & Plan:  Brian Estes "Brian Estes" was seen today for nasal congestion and facial pain.  Diagnoses and all orders for this visit:  Acute non-recurrent pansinusitis -     amoxicillin-clavulanate (AUGMENTIN) 875-125 MG tablet; Take 1 tablet by mouth 2 (two) times daily for 7 days.     Assessment and Plan    Acute Sinusitis Presents with symptoms consistent with acute sinusitis, including sinus pressure, watery ears, and thick postnasal drainage persisting for over a week without fever. Examination reveals fluid behind the ears and sinus congestion, particularly in the cheeks. Self-managing with pseudoephedrine and acetaminophen but requests a Depo-Medrol injection, which he has found effective in the past. Sinus infections can occur without fever, and prolonged congestion can lead to bacterial infection. Depo-Medrol 80 mg  injection is expected to alleviate symptoms. If symptoms do not improve in two days, Augmentin will be initiated as it is effective for sinus infections. - Administer Depo-Medrol 80 mg injection. - Prescribe Augmentin to be taken if symptoms do not improve in two days.  Crohn's Disease Crohn's disease may benefit from the Depo-Medrol injection, which he believes may also provide symptomatic relief for Crohn's symptoms. - Administer Depo-Medrol 80 mg injection, which may also provide symptomatic relief for Crohn's disease.          Continue all other maintenance medications.  Follow up plan: Return if symptoms worsen or fail to improve.   Continue healthy lifestyle choices, including diet (rich in fruits, vegetables, and lean proteins, and low in salt and simple carbohydrates) and exercise (at least 30 minutes of moderate physical activity daily).    The above assessment and management plan was  discussed with the patient. The patient verbalized understanding of and has agreed to the management plan. Patient is aware to call the clinic if they develop any new symptoms or if symptoms persist or worsen. Patient is aware when to return to the clinic for a follow-up visit. Patient educated on when it is appropriate to go to the emergency department.   Kari Baars, FNP-C [facility] [phone]

## 2024-01-27 NOTE — Addendum Note (Signed)
 Addended by: Angela Adam on: 01/27/2024 03:12 PM   Modules accepted: Orders

## 2024-03-29 ENCOUNTER — Telehealth: Payer: Self-pay | Admitting: Internal Medicine

## 2024-03-29 ENCOUNTER — Other Ambulatory Visit: Payer: Self-pay | Admitting: Family Medicine

## 2024-03-29 DIAGNOSIS — I10 Essential (primary) hypertension: Secondary | ICD-10-CM

## 2024-03-29 MED ORDER — CYANOCOBALAMIN 1000 MCG/ML IJ SOLN: 1000.0000 ug | INTRAMUSCULAR | 1 refills | Status: DC

## 2024-03-29 NOTE — Telephone Encounter (Signed)
 Contacted patient who states that he wants to go back on B12 shots. I advised that it appears when he was on the shots several years back, his PCP prescribed and administered injections. She is also the last provider to get B12 labwork on him so advised that it may be best for him to check with his PCP office to restart B12 if appropriate. Patient states that Dr Bridgett Camps had him on B12 injections and that Dr didn't have anything to do with it.   I inquired to the patient as to why he feels that he needs to restart b12 injections. He tells me he has been very tired lately and feels this will help. States that since he's started Linzess  72 mcg his "stomach stays tore up." Denies any rectal bleeding or pain. When asked how many BM's he has daily, he says that he cannot quantify but may just be one daily. Describes this bowel movement as diarrhea. Patient states "Dr Bridgett Camps knows all about it already."

## 2024-03-29 NOTE — Telephone Encounter (Signed)
Rx sent. Patient advised. 

## 2024-03-29 NOTE — Telephone Encounter (Signed)
 Patient called and stated that he would like to know if he is able to go back on B12 shot. Patient is requesting a call back. Please advise.

## 2024-03-29 NOTE — Telephone Encounter (Signed)
 Ok to resume IM B12 once monthly JMP

## 2024-04-12 ENCOUNTER — Encounter: Payer: Self-pay | Admitting: Family Medicine

## 2024-04-12 ENCOUNTER — Ambulatory Visit: Admitting: Family Medicine

## 2024-04-12 VITALS — BP 127/71 | HR 71 | Temp 98.5°F | Ht 70.0 in | Wt 190.2 lb

## 2024-04-12 DIAGNOSIS — R0982 Postnasal drip: Secondary | ICD-10-CM

## 2024-04-12 DIAGNOSIS — R053 Chronic cough: Secondary | ICD-10-CM

## 2024-04-12 MED ORDER — AMOXICILLIN-POT CLAVULANATE 875-125 MG PO TABS: 1.0000 | ORAL_TABLET | Freq: Two times a day (BID) | ORAL | 0 refills | Status: AC

## 2024-04-12 MED ORDER — MONTELUKAST SODIUM 10 MG PO TABS: 10.0000 mg | ORAL_TABLET | Freq: Every day | ORAL | 3 refills | Status: DC

## 2024-04-12 NOTE — Progress Notes (Signed)
 Subjective:  Patient ID: Brian Estes, male    DOB: 10/08/1961, 63 y.o.   MRN: [id number]  Patient Care Team: Galvin Jules, FNP as PCP - General (Family Medicine)   Chief Complaint:  Cough (Dry cough that has been on a few weeks.  States he has no other symptoms )   HPI: Brian ZIEGER is a 63 y.o. male presenting on 04/12/2024 for Cough (Dry cough that has been on a few weeks.  States he has no other symptoms )   Brian Estes "[name]" is a 63 year old male with Crohn's disease who presents with a persistent cough.  He has been experiencing a persistent 'hacking cough' that is not constant. He can walk three miles in the morning without coughing, but sometimes starts coughing after eating. The cough is associated with clear phlegm. No fever is present.  He has a history of Crohn's disease, which is currently not well-controlled. He mentions that Linzess  is causing discomfort. He has experienced significant weight loss, from 217 pounds to 190 pounds, and attributes some of this to his current medication regimen, including Olmesartan . He is concerned about his blood pressure, which he monitors regularly, noting it can be as low as 106/63. He is apprehensive about adjusting his medication due to fear of stroke.  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 Despite these challenges, he maintains a routine of walking three miles daily.          Relevant past medical, surgical, family, and social history reviewed and updated as indicated.  Allergies and medications reviewed and updated. Data reviewed: Chart in Epic.   Past Medical History:  Diagnosis Date   Colitis    Diverticulosis of colon (without mention of hemorrhage)    Fatty liver    Hyperlipemia    Hypertension    Internal hemorrhoids    Kidney disease    Other chronic nonalcoholic liver disease    Regional enteritis of unspecified site    Vitamin B12  deficiency    Vitamin D  deficiency     Past Surgical History:  Procedure Laterality Date   APPENDECTOMY  2001   INGUINAL HERNIA REPAIR  1976   KNEE SURGERY  1999   right    Social History   Socioeconomic History   Marital status: Married    Spouse name: Not on file   Number of children: 2   Years of education: Not on file   Highest education level: Not on file  Occupational History   Occupation: self employed-retired  Tobacco Use   Smoking status: Never   Smokeless tobacco: Never  Vaping Use   Vaping status: Never Used  Substance and Sexual Activity   Alcohol use: Yes    Comment: rare   Drug use: No   Sexual activity: Not on file  Other Topics Concern   Not on file  Social History Narrative   Not on file   Social Drivers of Health   Financial Resource Strain: Not on file  Food Insecurity: Not on file  Transportation Needs: Not on file  Physical Activity: Not on file  Stress: Not on file  Social Connections: Not on file  Intimate Partner Violence: Not on file    Outpatient Encounter Medications as of 04/12/2024  Medication Sig   amoxicillin -clavulanate (AUGMENTIN ) 875-125 MG tablet Take 1 tablet by mouth 2 (two) times daily for 10  days.   cyanocobalamin  (VITAMIN B12) 1000 MCG/ML injection Inject 1 mL (1,000 mcg total) into the muscle every 30 (thirty) days. Pharmacy-please include appropriate needles and syringes with this medication   linaclotide  (LINZESS ) 72 MCG capsule Take 1 capsule (72 mcg total) by mouth daily before breakfast.   montelukast (SINGULAIR) 10 MG tablet Take 1 tablet (10 mg total) by mouth at bedtime.   olmesartan  (BENICAR ) 40 MG tablet TAKE 1 TABLET BY MOUTH EVERY DAY   No facility-administered encounter medications on file as of 04/12/2024.    Allergies  Allergen Reactions   Aspirin    Lipitor [Atorvastatin] Other (See Comments)    myalgia    Pertinent ROS per HPI, otherwise unremarkable      Objective:  BP 127/71   Pulse 71    Temp 98.5 F (36.9 C)   Ht 5\' 10"  (1.778 m)   Wt 190 lb 3.2 oz (86.3 kg)   SpO2 96%   BMI 27.29 kg/m    Wt Readings from Last 3 Encounters:  04/12/24 190 lb 3.2 oz (86.3 kg)  01/27/24 197 lb (89.4 kg)  11/19/23 192 lb (87.1 kg)    Physical Exam Vitals and nursing note reviewed.  Constitutional:      General: He is not in acute distress.    Appearance: Normal appearance. He is well-developed, well-groomed and overweight. He is not ill-appearing, toxic-appearing or diaphoretic.  HENT:     Head: Normocephalic and atraumatic.     Right Ear: Tympanic membrane, ear canal and external ear normal.     Left Ear: Tympanic membrane, ear canal and external ear normal.     Mouth/Throat:     Lips: Pink.     Mouth: Mucous membranes are moist.     Pharynx: Oropharynx is clear. Postnasal drip present. No oropharyngeal exudate or posterior oropharyngeal erythema.  Eyes:     Conjunctiva/sclera: Conjunctivae normal.     Pupils: Pupils are equal, round, and reactive to light.  Cardiovascular:     Rate and Rhythm: Normal rate and regular rhythm.     Heart sounds: Normal heart sounds.  Pulmonary:     Effort: Pulmonary effort is normal.     Breath sounds: Normal breath sounds.  Musculoskeletal:     Cervical back: Neck supple.  Skin:    General: Skin is warm and dry.     Capillary Refill: Capillary refill takes less than 2 seconds.  Neurological:     General: No focal deficit present.     Mental Status: He is alert and oriented to person, place, and time.  Psychiatric:        Mood and Affect: Mood normal.        Behavior: Behavior normal. Behavior is cooperative.        Thought Content: Thought content normal.        Judgment: Judgment normal.     Results for orders placed or performed in visit on 10/06/23  CBC with Differential/Platelet   Collection Time: 10/06/23  8:20 AM  Result Value Ref Range   WBC 7.5 3.4 - 10.8 x10E3/uL   RBC 4.59 4.14 - 5.80 x10E6/uL   Hemoglobin 14.1 13.0 -  17.7 g/dL   Hematocrit 16.1 09.6 - 51.0 %   MCV 95 79 - 97 fL   MCH 30.7 26.6 - 33.0 pg   MCHC 32.5 31.5 - 35.7 g/dL   RDW 04.5 40.9 - 81.1 %   Platelets 348 150 - 450 x10E3/uL   Neutrophils 57 Not  Estab. %   Lymphs 28 Not Estab. %   Monocytes 12 Not Estab. %   Eos 2 Not Estab. %   Basos 1 Not Estab. %   Neutrophils Absolute 4.3 1.4 - 7.0 x10E3/uL   Lymphocytes Absolute 2.1 0.7 - 3.1 x10E3/uL   Monocytes Absolute 0.9 0.1 - 0.9 x10E3/uL   EOS (ABSOLUTE) 0.2 0.0 - 0.4 x10E3/uL   Basophils Absolute 0.1 0.0 - 0.2 x10E3/uL   Immature Granulocytes 0 Not Estab. %   Immature Grans (Abs) 0.0 0.0 - 0.1 x10E3/uL  CMP14+EGFR   Collection Time: 10/06/23  8:20 AM  Result Value Ref Range   Glucose 92 70 - 99 mg/dL   BUN 17 8 - 27 mg/dL   Creatinine, Ser 1.61 0.76 - 1.27 mg/dL   eGFR 91 >09 UE/AVW/0.98   BUN/Creatinine Ratio 18 10 - 24   Sodium 139 134 - 144 mmol/L   Potassium 4.2 3.5 - 5.2 mmol/L   Chloride 102 96 - 106 mmol/L   CO2 24 20 - 29 mmol/L   Calcium  9.7 8.6 - 10.2 mg/dL   Total Protein 7.7 6.0 - 8.5 g/dL   Albumin 4.2 3.9 - 4.9 g/dL   Globulin, Total 3.5 1.5 - 4.5 g/dL   Bilirubin Total 0.5 0.0 - 1.2 mg/dL   Alkaline Phosphatase 103 44 - 121 IU/L   AST 23 0 - 40 IU/L   ALT 20 0 - 44 IU/L  Lipid panel   Collection Time: 10/06/23  8:20 AM  Result Value Ref Range   Cholesterol, Total 210 (H) 100 - 199 mg/dL   Triglycerides 119 (H) 0 - 149 mg/dL   HDL 32 (L) >14 mg/dL   VLDL Cholesterol Cal 31 5 - 40 mg/dL   LDL Chol Calc (NIH) 782 (H) 0 - 99 mg/dL   Chol/HDL Ratio 6.6 (H) 0.0 - 5.0 ratio  Thyroid  Panel With TSH   Collection Time: 10/06/23  8:20 AM  Result Value Ref Range   TSH 2.650 0.450 - 4.500 uIU/mL   T4, Total 7.6 4.5 - 12.0 ug/dL   T3 Uptake Ratio 26 24 - 39 %   Free Thyroxine Index 2.0 1.2 - 4.9  PSA, total and free   Collection Time: 10/06/23  8:20 AM  Result Value Ref Range   Prostate Specific Ag, Serum 1.0 0.0 - 4.0 ng/mL   PSA, Free 0.27 N/A ng/mL    PSA, Free Pct 27.0 %  Vitamin B12   Collection Time: 10/06/23  8:20 AM  Result Value Ref Range   Vitamin B-12 162 (L) 232 - 1,245 pg/mL  VITAMIN D  25 Hydroxy (Vit-D Deficiency, Fractures)   Collection Time: 10/06/23  8:20 AM  Result Value Ref Range   Vit D, 25-Hydroxy 19.8 (L) 30.0 - 100.0 ng/mL       Pertinent labs & imaging results that were available during my care of the patient were reviewed by me and considered in my medical decision making.  Assessment & Plan:  Alarik "[name]" was seen today for cough.  Diagnoses and all orders for this visit:  Chronic cough -     montelukast (SINGULAIR) 10 MG tablet; Take 1 tablet (10 mg total) by mouth at bedtime. -     amoxicillin -clavulanate (AUGMENTIN ) 875-125 MG tablet; Take 1 tablet by mouth 2 (two) times daily for 10 days.  Postnasal drip -     montelukast (SINGULAIR) 10 MG tablet; Take 1 tablet (10 mg total) by mouth at bedtime. -  amoxicillin -clavulanate (AUGMENTIN ) 875-125 MG tablet; Take 1 tablet by mouth 2 (two) times daily for 10 days.      Chronic Cough Chronic cough exacerbated by eating and talking, associated with clear phlegm, likely due to chronic postnasal drip. No fever and not of bacterial origin. Singulair recommended as it calms postnasal drip without contraindications for Crohn's disease. - Prescribe Singulair to be taken nightly at bedtime. - Provide information on Singulair. - Prescribe Augmentin  as a backup if Singulair does not improve symptoms after 2-3 days.  Crohn's Disease Experiencing a flare-up causing significant fatigue. Cautious about medications due to potential exacerbation of symptoms. Linzess  is causing discomfort.  Surgical Consideration for Crohn's Disease Advised to consider surgical resection of an 8-inch intestinal segment. Apprehensive due to potential complications, including a 10% risk of leakage and other postoperative issues. Informed consent includes understanding of risks, benefits,  and alternatives. Encouraged to weigh risks and benefits and consider future necessity if symptoms worsen. - Encourage further research on the surgery and weigh risks and benefits. - Discuss possibility of needing surgery in the future if symptoms worsen.  Hypertension On olmesartan  but reports episodes of hypotension, particularly at night, with readings as low as 106/63 mmHg. Current reading is 127/71 mmHg. Concerned about stroke risk if medication is adjusted. Educated that 150/90 mmHg and below is acceptable for his age. - Reduce olmesartan  dosage by half and monitor blood pressure. - Educate on acceptable blood pressure ranges for his age.           Continue all other maintenance medications.  Follow up plan: Return if symptoms worsen or fail to improve.   Continue healthy lifestyle choices, including diet (rich in fruits, vegetables, and lean proteins, and low in salt and simple carbohydrates) and exercise (at least 30 minutes of moderate physical activity daily).  Educational handout given for Singulair  The above assessment and management plan was discussed with the patient. The patient verbalized understanding of and has agreed to the management plan. Patient is aware to call the clinic if they develop any new symptoms or if symptoms persist or worsen. Patient is aware when to return to the clinic for a follow-up visit. Patient educated on when it is appropriate to go to the emergency department.   Kattie Parrot, FNP-C [facility] [phone]

## 2024-05-23 ENCOUNTER — Ambulatory Visit: Admitting: Physician Assistant

## 2024-05-29 ENCOUNTER — Other Ambulatory Visit: Payer: Self-pay | Admitting: General Surgery

## 2024-05-29 DIAGNOSIS — K56699 Other intestinal obstruction unspecified as to partial versus complete obstruction: Secondary | ICD-10-CM

## 2024-06-02 ENCOUNTER — Ambulatory Visit
Admission: RE | Admit: 2024-06-02 | Discharge: 2024-06-02 | Disposition: A | Discharge status: Home / Self Care | Source: Ambulatory Visit | Attending: General Surgery | Admitting: General Surgery

## 2024-06-02 DIAGNOSIS — K56699 Other intestinal obstruction unspecified as to partial versus complete obstruction: Secondary | ICD-10-CM

## 2024-06-02 MED ORDER — IOPAMIDOL (ISOVUE-300) INJECTION 61%
100.0000 mL | Freq: Once | INTRAVENOUS | Status: AC | PRN
  Administered 2024-06-02: 100 mL via INTRAVENOUS

## 2024-07-03 ENCOUNTER — Ambulatory Visit: Payer: Self-pay | Admitting: General Surgery

## 2024-07-03 NOTE — H&P (Signed)
 PROVIDER:  Deshanda Molitor CHRISTINE Malanie Koloski, MD  MRN: [id number] DOB: 1961/01/12 DATE OF ENCOUNTER: 07/03/2024  Subjective   Chief Complaint: Follow-up (1 month f/u- Diverticulosis/segmental colitis/)     History of Present Illness:  .  65 male with chronic constipation who presents to the office for evaluation of a diverticular stricture which was seen on colonoscopy in May 2024.  Patient has a history of colitis and has undergone regular colonoscopies every 3 to 5 years.  He reports that he has been on Linzess  and has minimal symptoms at this time.  He has not had any complicated episodes of diverticulitis.  Past Medical History:  Diagnosis Date   Colitis    Diverticulosis of colon (without mention of hemorrhage)    Fatty liver    Hyperlipemia    Hypertension    Internal hemorrhoids    Kidney disease    Other chronic nonalcoholic liver disease    Regional enteritis of unspecified site    Vitamin B12 deficiency    Vitamin D  deficiency     Past Surgical History:  Procedure Laterality Date   APPENDECTOMY  2001   INGUINAL HERNIA REPAIR  1976   KNEE SURGERY  1999   right    Family History  Problem Relation Age of Onset   Heart disease Mother    Parkinson's disease Father    Colon cancer Neg Hx    Esophageal cancer Neg Hx    Stomach cancer Neg Hx    Pancreatic cancer Neg Hx     Social History   Socioeconomic History   Marital status: Married    Spouse name: Not on file   Number of children: 2   Years of education: Not on file   Highest education level: Not on file  Occupational History   Occupation: self employed-retired  Tobacco Use   Smoking status: Never   Smokeless tobacco: Never  Vaping Use   Vaping status: Never Used  Substance and Sexual Activity   Alcohol use: Yes    Comment: rare   Drug use: No   Sexual activity: Not on file  Other Topics Concern   Not on file  Social History Narrative   Not on file   Social Drivers of Health   Financial Resource  Strain: Not on file  Food Insecurity: Not on file  Transportation Needs: Not on file  Physical Activity: Not on file  Stress: Not on file  Social Connections: Not on file  Intimate Partner Violence: Not on file     Current Outpatient Medications:    cyanocobalamin  (VITAMIN B12) 1000 MCG/ML injection, Inject 1 mL (1,000 mcg total) into the muscle every 30 (thirty) days. Pharmacy-please include appropriate needles and syringes with this medication, Disp: 3 mL, Rfl: 1   linaclotide  (LINZESS ) 72 MCG capsule, Take 1 capsule (72 mcg total) by mouth daily before breakfast., Disp: 30 capsule, Rfl: 3   montelukast  (SINGULAIR ) 10 MG tablet, Take 1 tablet (10 mg total) by mouth at bedtime., Disp: 30 tablet, Rfl: 3   olmesartan  (BENICAR ) 40 MG tablet, TAKE 1 TABLET BY MOUTH EVERY DAY, Disp: 90 tablet, Rfl: 1  Allergies  Allergen Reactions   Aspirin    Lipitor [Atorvastatin] Other (See Comments)    myalgia    Review of Systems - Negative except as noted above   Objective:    Vitals:   07/03/24 1509  PainSc: 0-No pain      Exam Gen: NAD Abd: soft, NT    Labs, Imaging and  Diagnostic Testing: CT scan performed July 2025 shows significant sigmoid diverticulosis with sigmoid stricture and pelvic inflammation consistent with chronic episodes of diverticulitis.  Assessment and Plan:  Diagnoses and all orders for this visit:  Diverticular stricture (CMS/HHS-HCC)    Patient with several episodes of recurrent diverticulitis and diverticular stricture on most recent colonoscopy.  We again discussed surgical treatment.  He thinks he may want to have a surgical resection in early Dec.  We went over the risk and benefits of surgery again.  We discussed that this is a prophylactic surgery to reduce risk in the future.  All questions were answered.  The surgery and anatomy were described to the patient as well as the risks of surgery and the possible complications.  These include: Bleeding,  deep abdominal infections and possible wound complications such as hernia and infection, damage to adjacent structures, leak of surgical connections, which can lead to other surgeries and possibly an ostomy, possible need for other procedures, such as abscess drains in radiology, possible prolonged hospital stay, possible diarrhea from removal of part of the colon, possible constipation from narcotics, possible bowel, bladder or sexual dysfunction if having rectal surgery, prolonged fatigue/weakness or appetite loss, possible early recurrence of of disease, possible complications of their medical problems such as heart disease or arrhythmias or lung problems, death (less than 1%). I believe the patient understands and wishes to proceed with the surgery.     Bernarda JAYSON Ned, MD Colon and Rectal Surgery [facility]

## 2024-07-08 ENCOUNTER — Other Ambulatory Visit: Payer: Self-pay | Admitting: Family Medicine

## 2024-07-08 DIAGNOSIS — R0982 Postnasal drip: Secondary | ICD-10-CM

## 2024-07-08 DIAGNOSIS — R053 Chronic cough: Secondary | ICD-10-CM

## 2024-08-09 ENCOUNTER — Encounter: Payer: Self-pay | Admitting: Internal Medicine

## 2024-08-09 ENCOUNTER — Ambulatory Visit: Admitting: Internal Medicine

## 2024-08-09 VITALS — BP 130/70 | HR 60 | Ht 70.0 in | Wt 188.0 lb

## 2024-08-09 DIAGNOSIS — K56699 Other intestinal obstruction unspecified as to partial versus complete obstruction: Secondary | ICD-10-CM

## 2024-08-09 DIAGNOSIS — K5730 Diverticulosis of large intestine without perforation or abscess without bleeding: Secondary | ICD-10-CM | POA: Diagnosis not present

## 2024-08-09 DIAGNOSIS — K760 Fatty (change of) liver, not elsewhere classified: Secondary | ICD-10-CM | POA: Diagnosis not present

## 2024-08-09 DIAGNOSIS — K5010 Crohn's disease of large intestine without complications: Secondary | ICD-10-CM | POA: Diagnosis not present

## 2024-08-09 NOTE — Patient Instructions (Signed)
 Continue Linzess  as needed.   Follow up in one year.   _______________________________________________________  If your blood pressure at your visit was 140/90 or greater, please contact your primary care physician to follow up on this.  _______________________________________________________  If you are age 64 or older, your body mass index should be between 23-30. Your Body mass index is 26.98 kg/m. If this is out of the aforementioned range listed, please consider follow up with your Primary Care Provider.  If you are age 17 or younger, your body mass index should be between 19-25. Your Body mass index is 26.98 kg/m. If this is out of the aformentioned range listed, please consider follow up with your Primary Care Provider.   ________________________________________________________  The [facility] providers would like to encourage you to use MYCHART to communicate with providers for non-urgent requests or questions.  Due to long hold times on the telephone, sending your provider a message by [facility] may be a faster and more efficient way to get a response.  Please allow 48 business hours for a response.  Please remember that this is for non-urgent requests.  _______________________________________________________  [facility] is using a team-based approach to care.  Your team is made up of your doctor and two to three APPS. Our APPS (Nurse Practitioners and Physician Assistants) work with your physician to ensure care continuity for you. They are fully qualified to address your health concerns and develop a treatment plan. They communicate directly with your gastroenterologist to care for you. Seeing the Advanced Practice Practitioners on your physician's team can help you by facilitating care more promptly, often allowing for earlier appointments, access to diagnostic testing, procedures, and other specialty referrals.

## 2024-08-09 NOTE — Progress Notes (Signed)
 Subjective:    Patient ID: Brian Estes, male    DOB: [birth year], 63 y.o.   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  He is here alone today.  I last saw him in January 2025.SABRA  He has a history of segmental colitis associated with diverticulosis and severe diverticular disease, leading to constipation from sigmoid diverticulosis. His last colonoscopy on Apr 02, 2023, revealed numerous diverticula in the sigmoid colon with narrowing and petechiae, while the rest of the colon appeared normal.  He is currently managing his symptoms with dietary changes and has not taken Linzess  since Saturday, reporting regular bowel movements without pain.  He is concerned about the timing of the surgery due to personal responsibilities, including caring for his elderly mother who is in a nursing home. He is an only child and manages her affairs. He is hesitant to proceed with surgery at this time and expresses concern about potential complications such as leaks or bowel obstruction, as discussed with his surgeon.  He inquires about alternative treatments such as balloon dilation. He walks three to four miles a day and has lost weight, now weighing 188 pounds from 200 pounds over four years. He avoids certain foods like cake and tortilla chips to prevent symptoms.  No rectal bleeding or melena.   Review of Systems As per HPI, otherwise negative  Current Medications, Allergies, Past Medical History, Past Surgical History, Family History and Social History were reviewed in [employer] record.     Objective:   Physical Exam BP 130/70   Pulse 60   Ht 5' 10 (1.778 m)   Wt 188 lb (85.3 kg)    BMI 26.98 kg/m  Gen: awake, alert, NAD HEENT: anicteric  Ext: no c/c/e Neuro: nonfocal  CT ABDOMEN AND PELVIS WITH CONTRAST   TECHNIQUE: Multidetector CT imaging of the abdomen and pelvis was performed using the standard protocol following bolus administration of intravenous contrast.   RADIATION DOSE REDUCTION: This exam was performed according to the departmental dose-optimization program which includes automated exposure control, adjustment of the mA and/or kV according to patient size and/or use of iterative reconstruction technique.   CONTRAST:  100mL ISOVUE -300 IOPAMIDOL  (ISOVUE -300) INJECTION 61%   COMPARISON:  Noncontrast CT on 09/24/2022   FINDINGS: Lower Chest: No acute findings.   Hepatobiliary: No suspicious hepatic masses identified. Gallbladder is unremarkable. No evidence of biliary ductal dilatation.   Pancreas:  No mass or inflammatory changes.   Spleen: Within normal limits in size and appearance.   Adrenals/Urinary Tract: No suspicious masses identified. No evidence of ureteral calculi or hydronephrosis.   Stomach/Bowel: Tiny hiatal hernia again noted. Severe diverticulosis again seen in the sigmoid colon. Increased soft tissue stranding in the pericolonic fat is consistent with active diverticulitis. No No evidence of perforation or abscess. No evidence of bowel obstruction.   Vascular/Lymphatic: No pathologically enlarged lymph nodes. No acute vascular findings.   Reproductive:  No mass or other significant abnormality.   Other:  None.   Musculoskeletal:  No suspicious bone lesion1s identified.   IMPRESSION: Severe sigmoid diverticulosis, with increased pericolonic soft tissue stranding consistent with active diverticulitis.   No evidence of perforation or abscess.   Tiny hiatal hernia.     Electronically  Signed   By: Norleen DELENA Kil M.D.   On: 06/02/2024 15:27  DIAGNOSTIC Colonoscopy: Multiple diverticula in the sigmoid colon with  narrowing and petechiae; the rest of the colon appeared normal (04/02/2023)    Assessment & Plan:  63 year old male with a past medical history of segmental colitis associated with diverticulosis complicated by diverticular stricture in the sigmoid, constipation as a result of symptomatic diverticular disease, prior fatty liver, hypertension hyperlipidemia and kidney stones who is here for follow-up.   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 Previous colonoscopy showed multiple diverticula with narrowing and petechiae. Asymptomatic todasy, managing without Linzess . Surgical resection recommended, but he is considering delaying due to personal circumstances. Discussed surgical risks including leakage and temporary colostomy. - Continue Linzess  72 mcg as needed. - Maintain dietary modifications. - Plan surgical resection, timing based on personal circumstances. - Follow up within a year to reassess.  2.  Hepatic steatosis --by prior imaging.  Considerable weight loss and frequent exercise.  Liver enzymes normal.  No evidence for advanced liver disease or chronic liver disease. --Continue healthy lifestyle and exercise regimen --Consider FibroScan in the future  3.  CRC screening --consider repeat colonoscopy May 2034  30 minutes total spent today including patient facing time, coordination of care, reviewing medical history/procedures/pertinent radiology studies, and documentation of the encounter.

## 2024-09-17 ENCOUNTER — Other Ambulatory Visit: Payer: Self-pay | Admitting: *Deleted

## 2024-09-17 DIAGNOSIS — I10 Essential (primary) hypertension: Secondary | ICD-10-CM

## 2024-10-12 ENCOUNTER — Encounter: Payer: Self-pay | Admitting: Family Medicine

## 2024-10-12 ENCOUNTER — Ambulatory Visit: Payer: BC Managed Care – PPO | Admitting: Family Medicine

## 2024-10-12 VITALS — BP 112/65 | HR 61 | Temp 98.4°F | Ht 70.0 in | Wt 185.6 lb

## 2024-10-12 DIAGNOSIS — E538 Deficiency of other specified B group vitamins: Secondary | ICD-10-CM

## 2024-10-12 DIAGNOSIS — E559 Vitamin D deficiency, unspecified: Secondary | ICD-10-CM

## 2024-10-12 DIAGNOSIS — J069 Acute upper respiratory infection, unspecified: Secondary | ICD-10-CM

## 2024-10-12 DIAGNOSIS — R0982 Postnasal drip: Secondary | ICD-10-CM

## 2024-10-12 DIAGNOSIS — Z0000 Encounter for general adult medical examination without abnormal findings: Secondary | ICD-10-CM

## 2024-10-12 DIAGNOSIS — I10 Essential (primary) hypertension: Secondary | ICD-10-CM

## 2024-10-12 DIAGNOSIS — R053 Chronic cough: Secondary | ICD-10-CM

## 2024-10-12 DIAGNOSIS — R351 Nocturia: Secondary | ICD-10-CM

## 2024-10-12 MED ORDER — OLMESARTAN MEDOXOMIL 20 MG PO TABS: 20.0000 mg | ORAL_TABLET | Freq: Every day | ORAL | 3 refills | Status: AC

## 2024-10-12 MED ORDER — AMOXICILLIN 875 MG PO TABS: 875.0000 mg | ORAL_TABLET | Freq: Two times a day (BID) | ORAL | 0 refills | Status: AC

## 2024-10-12 MED ORDER — MONTELUKAST SODIUM 10 MG PO TABS: 10.0000 mg | ORAL_TABLET | Freq: Every day | ORAL | 3 refills | Status: AC

## 2024-10-12 NOTE — Progress Notes (Signed)
 Complete physical exam  Patient: Brian Estes   DOB: 03/26/1961   63 y.o. Male  MRN: [id number]  Subjective:    Chief Complaint  Patient presents with   Annual Exam    Chapin KEMPER HEUPEL is a 63 y.o. male who presents today for a complete physical exam. He reports consuming a general diet. Home exercise routine includes walking 1.5-2 hrs per day. He generally feels well. He reports sleeping well. He does not have additional problems to discuss today.    Mardy M Dearion Huot is a 63 year old male who presents with concerns about recent gastrointestinal issues and hemorrhoids.  He has been experiencing significant gastrointestinal distress since November 8th, characterized by diarrhea that persisted for two days. This episode led to the development of hemorrhoids, which have been bothersome, though he has not sought treatment for them yet.  He experienced a boil on his buttocks, which has since reduced in size. He describes a sensation of heaviness in his testicles, feeling as though they weighed 'eighteen pounds a piece' for about one to two weeks, but this has resolved.  He has a history of hypertension and has been managing it by halving his blood pressure medication, which is currently at a dose of 20 mg. His home blood pressure readings are around 120/70 mmHg.  He has lost weight, dropping from 217 pounds to 185 pounds, which he attributes to regular physical activity, walking two to three miles daily. He notes that he weighed 185 pounds in high school.  He has not been taking B12 supplements due to adverse reactions and has not been fasting for blood work, which he believes affects his cholesterol readings. He is not interested in checking his cholesterol as he does not wish to take medication for it.  He gets up once at night to urinate, with no significant changes in frequency or urgency. 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  He  experiences anxiety, particularly in situations where he is waiting, such as at horse shows or airports. He manages this with a children's chewable Benadryl as needed.  He has a history of skin issues and recently visited a dermatologist, who froze spots on his face that were described as 'pre-cancer'.       Most recent fall risk assessment:    10/12/2024    8:19 AM  Fall Risk   Falls in the past year? 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   Family history of heart disease 01/16/2016   Essential hypertension 06/26/2014   Statin intolerance 12/11/2013   BPH (benign prostatic hyperplasia) 12/11/2013   Vitamin D  deficiency 12/11/2013   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   B12 deficiency 05/24/2009   Hyperlipidemia 05/21/2008   Regional enteritis (HCC) 05/21/2008   Diverticulosis of colon 05/21/2008   Constipation 05/21/2008   Fatty liver 05/21/2008  Past Medical History:  Diagnosis Date   Colitis    Diverticulosis of colon (without mention of hemorrhage)    Fatty liver    Hyperlipemia    Hypertension    Internal hemorrhoids    Kidney disease    Other chronic nonalcoholic liver disease    Regional enteritis of unspecified site    Vitamin B12 deficiency    Vitamin D  deficiency    Past Surgical History:  Procedure Laterality Date   APPENDECTOMY  2001   INGUINAL HERNIA REPAIR  1976   KNEE SURGERY  1999   right   Social History   Tobacco Use   Smoking status: Never   Smokeless tobacco: Never  Vaping Use   Vaping status: Never Used  Substance  Use Topics   Alcohol use: Yes    Comment: rare   Drug use: No   Social History   Socioeconomic History   Marital status: Married    Spouse name: Not on file   Number of children: 2   Years of education: Not on file   Highest education level: Not on file  Occupational History   Occupation: self employed-retired  Tobacco Use   Smoking status: Never   Smokeless tobacco: Never  Vaping Use   Vaping status: Never Used  Substance and Sexual Activity   Alcohol use: Yes    Comment: rare   Drug use: No   Sexual activity: Not on file  Other Topics Concern   Not on file  Social History Narrative   Not on file   Social Drivers of Health   Financial Resource Strain: Not on file  Food Insecurity: Not on file  Transportation Needs: Not on file  Physical Activity: Not on file  Stress: Not on file  Social Connections: Not on file  Intimate Partner Violence: Not on file   Family Status  Relation Name Status   Mother  Alive   Father  Deceased   Neg Hx  (Not Specified)  No partnership data on file   Family History  Problem Relation Age of Onset   Heart disease Mother    Parkinson's disease Father    Colon cancer Neg Hx    Esophageal cancer Neg Hx    Stomach cancer Neg Hx    Pancreatic cancer Neg Hx    Allergies  Allergen Reactions   Aspirin    Lipitor [Atorvastatin] Other (See Comments)    myalgia      Patient Care Team: Severa Rock HERO, FNP as PCP - General (Family Medicine)   Outpatient Medications Prior to Visit  Medication Sig   linaclotide  (LINZESS ) 72 MCG capsule Take 1 capsule (72 mcg total) by mouth daily before breakfast. (Patient taking differently: Take 72 mcg by mouth daily before breakfast. Uses prn)   [DISCONTINUED] cyanocobalamin  (VITAMIN B12) 1000 MCG/ML injection Inject 1 mL (1,000 mcg total) into the muscle every 30 (thirty) days. Pharmacy-please include appropriate needles and syringes with this medication (Patient not taking: Reported on 08/09/2024)    [DISCONTINUED] montelukast  (SINGULAIR ) 10 MG tablet TAKE 1 TABLET BY MOUTH EVERYDAY AT BEDTIME (Patient taking differently: As needed)   [DISCONTINUED] olmesartan  (BENICAR ) 40 MG tablet TAKE 1 TABLET BY MOUTH EVERY DAY   No facility-administered medications prior to visit.    ROS per HPI      Objective:     BP 112/65   Pulse 61   Temp 98.4 F (36.9 C)   Ht 5' 10 (1.778 m)   Wt 185 lb  9.6 oz (84.2 kg)   SpO2 96%   BMI 26.63 kg/m  BP Readings from Last 3 Encounters:  10/12/24 112/65  08/09/24 130/70  04/12/24 127/71   Wt Readings from Last 3 Encounters:  10/12/24 185 lb 9.6 oz (84.2 kg)  08/09/24 188 lb (85.3 kg)  04/12/24 190 lb 3.2 oz (86.3 kg)   SpO2 Readings from Last 3 Encounters:  10/12/24 96%  04/12/24 96%  01/27/24 93%      Physical Exam Vitals and nursing note reviewed.  Constitutional:      General: He is not in acute distress.    Appearance: Normal appearance. He is well-developed and well-groomed. He is not ill-appearing, toxic-appearing or diaphoretic.  HENT:     Head: Normocephalic and atraumatic.     Jaw: There is normal jaw occlusion.     Right Ear: Hearing, tympanic membrane, ear canal and external ear normal.     Left Ear: Hearing, tympanic membrane, ear canal and external ear normal.     Nose: Nose normal.     Mouth/Throat:     Lips: Pink.     Mouth: Mucous membranes are moist.     Pharynx: Oropharynx is clear. Uvula midline.  Eyes:     General: Lids are normal.     Extraocular Movements: Extraocular movements intact.     Conjunctiva/sclera: Conjunctivae normal.     Pupils: Pupils are equal, round, and reactive to light.  Neck:     Thyroid : No thyroid  mass, thyromegaly or thyroid  tenderness.     Vascular: No carotid bruit or JVD.     Trachea: Trachea and phonation normal.  Cardiovascular:     Rate and Rhythm: Normal rate and regular rhythm.     Chest Wall: PMI is not displaced.     Pulses: Normal pulses.     Heart sounds: Normal  heart sounds. No murmur heard.    No friction rub. No gallop.  Pulmonary:     Effort: Pulmonary effort is normal. No respiratory distress.     Breath sounds: Normal breath sounds. No wheezing.  Abdominal:     General: Bowel sounds are normal. There is no distension or abdominal bruit.     Palpations: Abdomen is soft. There is no hepatomegaly or splenomegaly.     Tenderness: There is no abdominal tenderness. There is no right CVA tenderness or left CVA tenderness.     Hernia: No hernia is present.  Musculoskeletal:        General: Normal range of motion.     Cervical back: Normal range of motion and neck supple.     Right lower leg: No edema.     Left lower leg: No edema.  Lymphadenopathy:     Cervical: No cervical adenopathy.  Skin:    General: Skin is warm and dry.     Capillary Refill: Capillary refill takes less than 2 seconds.     Coloration: Skin is not cyanotic, jaundiced or pale.     Findings: No rash.  Neurological:     General: No focal deficit present.     Mental Status: He is alert and oriented to person, place, and time.     Sensory: Sensation is intact.     Motor: Motor function is intact.     Coordination: Coordination is intact.     Gait: Gait is intact.     Deep Tendon Reflexes: Reflexes are normal and symmetric.  Psychiatric:        Attention and Perception: Attention  and perception normal.        Mood and Affect: Mood and affect normal.        Speech: Speech normal.        Behavior: Behavior normal. Behavior is cooperative.        Thought Content: Thought content normal.        Cognition and Memory: Cognition and memory normal.        Judgment: Judgment normal.       Last CBC Lab Results  Component Value Date   WBC 7.5 10/06/2023   HGB 14.1 10/06/2023   HCT 43.4 10/06/2023   MCV 95 10/06/2023   MCH 30.7 10/06/2023   RDW 12.4 10/06/2023   PLT 348 10/06/2023   Last metabolic panel Lab Results  Component Value Date   GLUCOSE 92 10/06/2023   NA  139 10/06/2023   K 4.2 10/06/2023   CL 102 10/06/2023   CO2 24 10/06/2023   BUN 17 10/06/2023   CREATININE 0.94 10/06/2023   EGFR 91 10/06/2023   CALCIUM  9.7 10/06/2023   PROT 7.7 10/06/2023   ALBUMIN 4.2 10/06/2023   LABGLOB 3.5 10/06/2023   AGRATIO 1.5 10/05/2022   BILITOT 0.5 10/06/2023   ALKPHOS 103 10/06/2023   AST 23 10/06/2023   ALT 20 10/06/2023   ANIONGAP 10 09/24/2022   Last lipids Lab Results  Component Value Date   CHOL 210 (H) 10/06/2023   HDL 32 (L) 10/06/2023   LDLCALC 147 (H) 10/06/2023   TRIG 167 (H) 10/06/2023   CHOLHDL 6.6 (H) 10/06/2023    Last thyroid  functions Lab Results  Component Value Date   TSH 2.650 10/06/2023   T4TOTAL 7.6 10/06/2023   Last vitamin D  Lab Results  Component Value Date   VD25OH 19.8 (L) 10/06/2023   Last vitamin B12 and Folate Lab Results  Component Value Date   VITAMINB12 162 (L) 10/06/2023   FOLATE 8.4 05/23/2009        Assessment & Plan:    Routine Health Maintenance and Physical Exam  Immunization History  Administered Date(s) Administered   Influenza Split 08/04/2013   Influenza Whole 08/14/2009   Influenza,inj,Quad PF,6+ Mos 09/16/2015, 08/03/2016, 08/23/2017, 08/16/2018, 08/28/2019, 09/25/2020   Influenza-Unspecified 08/08/2014, 09/12/2021   Moderna Sars-Covid-2 Vaccination 12/25/2019, 01/22/2020, 10/08/2020   Tdap 02/15/2017   Zoster Recombinant(Shingrix) 03/25/2020, 06/11/2020    Health Maintenance  Topic Date Due   COVID-19 Vaccine (4 - 2025-26 season) 10/28/2024 (Originally 07/10/2024)   Influenza Vaccine  02/06/2025 (Originally 06/09/2024)   Pneumococcal Vaccine: 50+ Years (1 of 1 - PCV) 10/12/2025 (Originally 07/13/2010)   HIV Screening  10/12/2025 (Originally [birth year]14/1975)   DTaP/Tdap/Td (2 - Td or Tdap) 02/16/2027   Colonoscopy  04/01/2028   Hepatitis C Screening  Completed   Zoster Vaccines- Shingrix  Completed   Hepatitis B Vaccines 19-59 Average Risk  Aged Out   HPV VACCINES  Aged Out    Meningococcal B Vaccine  Aged Out    Discussed health benefits of physical activity, and encouraged him to engage in regular exercise appropriate for his age and condition.  Problem List Items Addressed This Visit       Cardiovascular and Mediastinum   Essential hypertension   Relevant Medications   olmesartan  (BENICAR ) 20 MG tablet   Other Relevant Orders   CBC with Differential/Platelet   CMP14+EGFR   TSH   T4, free     Other   B12 deficiency - Primary   Relevant Orders   CBC with Differential/Platelet  Vitamin D  deficiency   Other Visit Diagnoses       Chronic cough       Relevant Medications   montelukast  (SINGULAIR ) 10 MG tablet     Postnasal drip       Relevant Medications   montelukast  (SINGULAIR ) 10 MG tablet     Annual physical exam       Relevant Orders   CBC with Differential/Platelet   CMP14+EGFR     Nocturia       Relevant Orders   PSA, total and free     URI with cough and congestion       Relevant Medications   amoxicillin  (AMOXIL ) 875 MG tablet         Essential hypertension Blood pressure is well-controlled with current medication regimen. Home readings are approximately 120/70 mmHg. He is hesitant to discontinue medication due to fear of stroke. - Continue current antihypertensive medication at 20 mg, with option to break in half if needed.  General Health Maintenance He is not interested in cholesterol screening or treatment. Declines flu vaccination due to past adverse experience. Declines HIV screening. Regular dental and dermatological care maintained. No recent eye examination. - Ordered PSA, thyroid , liver, and kidney function tests. - Prescribed amoxicillin  for potential future use. - Encouraged regular eye examination.       Return in about 1 year (around 10/12/2025) for Annual Physical.     Rosaline Bruns, FNP

## 2024-10-13 ENCOUNTER — Ambulatory Visit: Payer: Self-pay | Admitting: Family Medicine

## 2024-10-13 LAB — CBC WITH DIFFERENTIAL/PLATELET
Basophils Absolute: 0.1 x10E3/uL (ref 0.0–0.2)
Basos: 1 %
EOS (ABSOLUTE): 0.2 x10E3/uL (ref 0.0–0.4)
Eos: 3 %
Hematocrit: 43.1 % (ref 37.5–51.0)
Hemoglobin: 14.1 g/dL (ref 13.0–17.7)
Immature Grans (Abs): 0 x10E3/uL (ref 0.0–0.1)
Immature Granulocytes: 0 %
Lymphocytes Absolute: 1.9 x10E3/uL (ref 0.7–3.1)
Lymphs: 31 %
MCH: 30.3 pg (ref 26.6–33.0)
MCHC: 32.7 g/dL (ref 31.5–35.7)
MCV: 93 fL (ref 79–97)
Monocytes Absolute: 0.7 x10E3/uL (ref 0.1–0.9)
Monocytes: 11 %
Neutrophils Absolute: 3.4 x10E3/uL (ref 1.4–7.0)
Neutrophils: 54 %
Platelets: 374 x10E3/uL (ref 150–450)
RBC: 4.65 x10E6/uL (ref 4.14–5.80)
RDW: 12.9 % (ref 11.6–15.4)
WBC: 6.3 x10E3/uL (ref 3.4–10.8)

## 2024-10-13 LAB — CMP14+EGFR
ALT: 18 IU/L (ref 0–44)
AST: 20 IU/L (ref 0–40)
Albumin: 4.4 g/dL (ref 3.9–4.9)
Alkaline Phosphatase: 95 IU/L (ref 47–123)
BUN/Creatinine Ratio: 15 (ref 10–24)
BUN: 14 mg/dL (ref 8–27)
Bilirubin Total: 0.7 mg/dL (ref 0.0–1.2)
CO2: 23 mmol/L (ref 20–29)
Calcium: 9.6 mg/dL (ref 8.6–10.2)
Chloride: 102 mmol/L (ref 96–106)
Creatinine, Ser: 0.95 mg/dL (ref 0.76–1.27)
Globulin, Total: 3.2 g/dL (ref 1.5–4.5)
Glucose: 86 mg/dL (ref 70–99)
Potassium: 4.4 mmol/L (ref 3.5–5.2)
Sodium: 141 mmol/L (ref 134–144)
Total Protein: 7.6 g/dL (ref 6.0–8.5)
eGFR: 89 mL/min/1.73 (ref >59)

## 2024-10-13 LAB — T4, FREE: Free T4: 0.99 ng/dL (ref 0.82–1.77)

## 2024-10-13 LAB — PSA, TOTAL AND FREE
PSA, Free Pct: 15.7 %
PSA, Free: 0.33 ng/mL
Prostate Specific Ag, Serum: 2.1 ng/mL (ref 0.0–4.0)

## 2024-10-13 LAB — TSH: TSH: 2.47 u[IU]/mL (ref 0.450–4.500)

## 2025-10-16 ENCOUNTER — Encounter: Admitting: Family Medicine
